# Patient Record
Sex: Female | Born: 1985 | Race: White | Hispanic: No | Marital: Married | Smoking: Never smoker
Health system: Southern US, Community
[De-identification: ages and names within clinical notes are randomized; demographics above are authoritative.]

## PROBLEM LIST (undated history)

## (undated) DIAGNOSIS — R109 Unspecified abdominal pain: Secondary | ICD-10-CM

## (undated) DIAGNOSIS — R079 Chest pain, unspecified: Secondary | ICD-10-CM

## (undated) DIAGNOSIS — F411 Generalized anxiety disorder: Secondary | ICD-10-CM

## (undated) DIAGNOSIS — E876 Hypokalemia: Secondary | ICD-10-CM

## (undated) DIAGNOSIS — J189 Pneumonia, unspecified organism: Secondary | ICD-10-CM

## (undated) DIAGNOSIS — A4902 Methicillin resistant Staphylococcus aureus infection, unspecified site: Secondary | ICD-10-CM

## (undated) DIAGNOSIS — N200 Calculus of kidney: Secondary | ICD-10-CM

## (undated) DIAGNOSIS — D649 Anemia, unspecified: Secondary | ICD-10-CM

## (undated) DIAGNOSIS — R509 Fever, unspecified: Secondary | ICD-10-CM

## (undated) DIAGNOSIS — N10 Acute tubulo-interstitial nephritis: Secondary | ICD-10-CM

## (undated) DIAGNOSIS — E109 Type 1 diabetes mellitus without complications: Secondary | ICD-10-CM

## (undated) DIAGNOSIS — R51 Headache: Secondary | ICD-10-CM

## (undated) DIAGNOSIS — R519 Headache, unspecified: Secondary | ICD-10-CM

## (undated) DIAGNOSIS — N119 Chronic tubulo-interstitial nephritis, unspecified: Secondary | ICD-10-CM

## (undated) DIAGNOSIS — E039 Hypothyroidism, unspecified: Secondary | ICD-10-CM

## (undated) DIAGNOSIS — E7800 Pure hypercholesterolemia, unspecified: Secondary | ICD-10-CM

## (undated) HISTORY — DX: Pure hypercholesterolemia, unspecified: E78.00

## (undated) HISTORY — DX: Hypothyroidism, unspecified: E03.9

## (undated) HISTORY — DX: Acute pyelonephritis: N10

## (undated) HISTORY — DX: Chest pain, unspecified: R07.9

## (undated) HISTORY — DX: Calculus of kidney: N20.0

## (undated) HISTORY — DX: Headache, unspecified: R51.9

## (undated) HISTORY — DX: Headache: R51

## (undated) HISTORY — DX: Chronic tubulo-interstitial nephritis, unspecified: N11.9

## (undated) HISTORY — DX: Type 1 diabetes mellitus without complications: E10.9

## (undated) HISTORY — DX: Hypokalemia: E87.6

## (undated) HISTORY — DX: Fever, unspecified: R50.9

## (undated) HISTORY — DX: Pneumonia, unspecified organism: J18.9

## (undated) HISTORY — DX: Generalized anxiety disorder: F41.1

## (undated) HISTORY — DX: Unspecified abdominal pain: R10.9

## (undated) HISTORY — DX: Methicillin resistant Staphylococcus aureus infection, unspecified site: A49.02

## (undated) HISTORY — DX: Anemia, unspecified: D64.9

---
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
## 2004-10-25 ENCOUNTER — Ambulatory Visit: Payer: Self-pay | Admitting: Endocrinology

## 2005-02-08 ENCOUNTER — Ambulatory Visit: Payer: Self-pay | Admitting: Endocrinology

## 2005-07-21 ENCOUNTER — Ambulatory Visit: Payer: Self-pay | Admitting: Endocrinology

## 2005-07-28 ENCOUNTER — Ambulatory Visit: Admission: RE | Admit: 2005-07-28 | Discharge: 2005-07-28 | Payer: Self-pay | Admitting: Endocrinology

## 2006-03-22 ENCOUNTER — Ambulatory Visit: Payer: Self-pay | Admitting: Endocrinology

## 2006-04-25 ENCOUNTER — Ambulatory Visit: Payer: Self-pay | Admitting: Internal Medicine

## 2006-07-18 ENCOUNTER — Ambulatory Visit: Payer: Self-pay | Admitting: Endocrinology

## 2006-08-15 HISTORY — PX: WISDOM TOOTH EXTRACTION: SHX21

## 2006-11-23 ENCOUNTER — Ambulatory Visit: Payer: Self-pay | Admitting: Internal Medicine

## 2006-11-23 LAB — CONVERTED CEMR LAB
ALT: 24 units/L (ref 0–40)
AST: 24 units/L (ref 0–37)
Albumin: 2.9 g/dL — ABNORMAL LOW (ref 3.5–5.2)
Alkaline Phosphatase: 45 units/L (ref 39–117)
Basophils Relative: 0.1 % (ref 0.0–1.0)
Bilirubin, Direct: 0.1 mg/dL (ref 0.0–0.3)
CO2: 27 meq/L (ref 19–32)
Calcium: 8.8 mg/dL (ref 8.4–10.5)
Creatinine, Ser: 0.6 mg/dL (ref 0.4–1.2)
Direct LDL: 140.4 mg/dL
Eosinophils Absolute: 0.1 10*3/uL (ref 0.0–0.6)
GFR calc Af Amer: 161 mL/min
GFR calc non Af Amer: 133 mL/min
HCT: 33.5 % — ABNORMAL LOW (ref 36.0–46.0)
HDL: 54.6 mg/dL (ref >39.0)
Hemoglobin: 11.6 g/dL — ABNORMAL LOW (ref 12.0–15.0)
Hgb A1c MFr Bld: 12.4 % — ABNORMAL HIGH (ref 4.6–6.0)
Lipase: 11 units/L (ref 11.0–59.0)
Lymphocytes Relative: 16.1 % (ref 12.0–46.0)
Monocytes Absolute: 1 10*3/uL — ABNORMAL HIGH (ref 0.2–0.7)
Monocytes Relative: 10.5 % (ref 3.0–11.0)
Neutrophils Relative %: 72.5 % (ref 43.0–77.0)
Platelets: 240 10*3/uL (ref 150–400)
Potassium: 3.7 meq/L (ref 3.5–5.1)
RDW: 11.8 % (ref 11.5–14.6)

## 2006-11-28 ENCOUNTER — Ambulatory Visit: Payer: Self-pay | Admitting: Vascular Surgery

## 2006-11-28 ENCOUNTER — Ambulatory Visit: Payer: Self-pay | Admitting: Endocrinology

## 2006-11-28 ENCOUNTER — Ambulatory Visit (HOSPITAL_COMMUNITY): Admission: RE | Admit: 2006-11-28 | Discharge: 2006-11-28 | Payer: Self-pay | Admitting: Endocrinology

## 2006-11-28 ENCOUNTER — Encounter: Payer: Self-pay | Admitting: Vascular Surgery

## 2006-12-01 ENCOUNTER — Ambulatory Visit: Payer: Self-pay | Admitting: Endocrinology

## 2006-12-01 LAB — CONVERTED CEMR LAB
Basophils Absolute: 0 10*3/uL (ref 0.0–0.1)
Eosinophils Absolute: 0.1 10*3/uL (ref 0.0–0.6)
Eosinophils Relative: 0.6 % (ref 0.0–5.0)
Lymphocytes Relative: 18.5 % (ref 12.0–46.0)
Neutro Abs: 6.4 10*3/uL (ref 1.4–7.7)
Neutrophils Relative %: 72.1 % (ref 43.0–77.0)
Platelets: 543 10*3/uL — ABNORMAL HIGH (ref 150–400)
RBC: 3.52 M/uL — ABNORMAL LOW (ref 3.87–5.11)
WBC: 9 10*3/uL (ref 4.5–10.5)

## 2006-12-04 ENCOUNTER — Encounter: Admission: RE | Admit: 2006-12-04 | Discharge: 2006-12-04 | Payer: Self-pay | Admitting: Endocrinology

## 2006-12-06 ENCOUNTER — Ambulatory Visit: Payer: Self-pay | Admitting: Internal Medicine

## 2006-12-06 ENCOUNTER — Ambulatory Visit: Payer: Self-pay | Admitting: Endocrinology

## 2006-12-07 ENCOUNTER — Encounter (INDEPENDENT_AMBULATORY_CARE_PROVIDER_SITE_OTHER): Payer: Self-pay | Admitting: Specialist

## 2006-12-07 ENCOUNTER — Ambulatory Visit: Payer: Self-pay | Admitting: Internal Medicine

## 2006-12-07 HISTORY — PX: OTHER SURGICAL HISTORY: SHX169

## 2006-12-12 ENCOUNTER — Ambulatory Visit: Payer: Self-pay | Admitting: Endocrinology

## 2006-12-12 LAB — CONVERTED CEMR LAB
Basophils Absolute: 0 10*3/uL (ref 0.0–0.1)
Basophils Relative: 0.3 % (ref 0.0–1.0)
CO2: 22 meq/L (ref 19–32)
Calcium: 8 mg/dL — ABNORMAL LOW (ref 8.4–10.5)
Eosinophils Absolute: 0.1 10*3/uL (ref 0.0–0.6)
GFR calc Af Amer: 115 mL/min
Glucose, Bld: 147 mg/dL — ABNORMAL HIGH (ref 70–99)
Hemoglobin: 8.5 g/dL — ABNORMAL LOW (ref 12.0–15.0)
Lymphocytes Relative: 6.9 % — ABNORMAL LOW (ref 12.0–46.0)
Monocytes Relative: 7.5 % (ref 3.0–11.0)
Neutro Abs: 12.1 10*3/uL — ABNORMAL HIGH (ref 1.4–7.7)
Neutrophils Relative %: 84.9 % — ABNORMAL HIGH (ref 43.0–77.0)
RBC: 2.72 M/uL — ABNORMAL LOW (ref 3.87–5.11)
RDW: 12 % (ref 11.5–14.6)
Sodium: 136 meq/L (ref 135–145)

## 2006-12-13 ENCOUNTER — Ambulatory Visit: Payer: Self-pay | Admitting: Internal Medicine

## 2006-12-13 ENCOUNTER — Ambulatory Visit: Payer: Self-pay | Admitting: Endocrinology

## 2006-12-15 ENCOUNTER — Ambulatory Visit (HOSPITAL_COMMUNITY): Admission: RE | Admit: 2006-12-15 | Discharge: 2006-12-15 | Payer: Self-pay | Admitting: Endocrinology

## 2006-12-21 ENCOUNTER — Ambulatory Visit: Payer: Self-pay | Admitting: Endocrinology

## 2006-12-25 ENCOUNTER — Ambulatory Visit: Payer: Self-pay | Admitting: Endocrinology

## 2006-12-28 ENCOUNTER — Ambulatory Visit: Payer: Self-pay | Admitting: Endocrinology

## 2006-12-29 ENCOUNTER — Ambulatory Visit: Payer: Self-pay | Admitting: Endocrinology

## 2007-02-26 ENCOUNTER — Encounter: Payer: Self-pay | Admitting: Endocrinology

## 2007-02-26 DIAGNOSIS — E109 Type 1 diabetes mellitus without complications: Secondary | ICD-10-CM | POA: Insufficient documentation

## 2007-09-17 ENCOUNTER — Encounter: Payer: Self-pay | Admitting: Endocrinology

## 2007-09-18 ENCOUNTER — Telehealth: Payer: Self-pay | Admitting: Endocrinology

## 2007-09-20 ENCOUNTER — Encounter: Payer: Self-pay | Admitting: Endocrinology

## 2007-09-25 ENCOUNTER — Encounter: Payer: Self-pay | Admitting: Endocrinology

## 2007-10-05 ENCOUNTER — Ambulatory Visit: Payer: Self-pay | Admitting: Endocrinology

## 2007-10-05 DIAGNOSIS — D649 Anemia, unspecified: Secondary | ICD-10-CM | POA: Insufficient documentation

## 2007-10-05 DIAGNOSIS — J189 Pneumonia, unspecified organism: Secondary | ICD-10-CM | POA: Insufficient documentation

## 2007-10-05 HISTORY — DX: Anemia, unspecified: D64.9

## 2007-10-10 LAB — CONVERTED CEMR LAB
CO2: 29 meq/L (ref 19–32)
Calcium: 9.6 mg/dL (ref 8.4–10.5)
Chloride: 99 meq/L (ref 96–112)
Creatinine, Ser: 0.6 mg/dL (ref 0.4–1.2)
Eosinophils Absolute: 0.1 10*3/uL (ref 0.0–0.6)
GFR calc non Af Amer: 132 mL/min
HCT: 34.6 % — ABNORMAL LOW (ref 36.0–46.0)
Hemoglobin: 11.9 g/dL — ABNORMAL LOW (ref 12.0–15.0)
Lymphocytes Relative: 39.4 % (ref 12.0–46.0)
MCV: 90.9 fL (ref 78.0–100.0)
Monocytes Relative: 8.1 % (ref 3.0–11.0)
Neutrophils Relative %: 50.2 % (ref 43.0–77.0)
Platelets: 582 10*3/uL — ABNORMAL HIGH (ref 150–400)
Potassium: 3 meq/L — ABNORMAL LOW (ref 3.5–5.1)
Sodium: 138 meq/L (ref 135–145)

## 2008-01-23 ENCOUNTER — Telehealth: Payer: Self-pay | Admitting: Endocrinology

## 2008-08-21 ENCOUNTER — Telehealth: Payer: Self-pay | Admitting: Endocrinology

## 2008-08-26 ENCOUNTER — Ambulatory Visit: Payer: Self-pay | Admitting: Endocrinology

## 2008-08-26 DIAGNOSIS — E7800 Pure hypercholesterolemia, unspecified: Secondary | ICD-10-CM

## 2008-08-26 DIAGNOSIS — F411 Generalized anxiety disorder: Secondary | ICD-10-CM | POA: Insufficient documentation

## 2008-08-26 DIAGNOSIS — N119 Chronic tubulo-interstitial nephritis, unspecified: Secondary | ICD-10-CM | POA: Insufficient documentation

## 2008-08-26 DIAGNOSIS — E039 Hypothyroidism, unspecified: Secondary | ICD-10-CM | POA: Insufficient documentation

## 2008-08-26 HISTORY — DX: Hypothyroidism, unspecified: E03.9

## 2008-08-26 HISTORY — DX: Pure hypercholesterolemia, unspecified: E78.00

## 2008-08-26 HISTORY — DX: Generalized anxiety disorder: F41.1

## 2008-08-26 LAB — CONVERTED CEMR LAB
AST: 21 units/L (ref 0–37)
BUN: 16 mg/dL (ref 6–23)
Basophils Relative: 0.9 % (ref 0.0–3.0)
Bilirubin, Direct: 0.1 mg/dL (ref 0.0–0.3)
Calcium: 9.6 mg/dL (ref 8.4–10.5)
Chloride: 102 meq/L (ref 96–112)
Cholesterol: 261 mg/dL (ref 0–200)
Eosinophils Absolute: 0.2 10*3/uL (ref 0.0–0.7)
Eosinophils Relative: 4.7 % (ref 0.0–5.0)
GFR calc Af Amer: 158 mL/min
GFR calc non Af Amer: 131 mL/min
HDL: 36.8 mg/dL — ABNORMAL LOW (ref >39.0)
Hgb A1c MFr Bld: 10.8 % — ABNORMAL HIGH (ref 4.6–6.0)
MCHC: 35.1 g/dL (ref 30.0–36.0)
MCV: 91.8 fL (ref 78.0–100.0)
Microalb, Ur: 25.3 mg/dL — ABNORMAL HIGH (ref 0.0–1.9)
Monocytes Absolute: 0.4 10*3/uL (ref 0.1–1.0)
Neutro Abs: 1.8 10*3/uL (ref 1.4–7.7)
Potassium: 3.5 meq/L (ref 3.5–5.1)
RDW: 11.7 % (ref 11.5–14.6)
Total Bilirubin: 1.2 mg/dL (ref 0.3–1.2)
VLDL: 89 mg/dL — ABNORMAL HIGH (ref 0–40)

## 2008-08-29 ENCOUNTER — Telehealth (INDEPENDENT_AMBULATORY_CARE_PROVIDER_SITE_OTHER): Payer: Self-pay | Admitting: *Deleted

## 2008-09-10 ENCOUNTER — Telehealth (INDEPENDENT_AMBULATORY_CARE_PROVIDER_SITE_OTHER): Payer: Self-pay | Admitting: *Deleted

## 2008-09-11 ENCOUNTER — Telehealth (INDEPENDENT_AMBULATORY_CARE_PROVIDER_SITE_OTHER): Payer: Self-pay | Admitting: *Deleted

## 2008-09-15 ENCOUNTER — Encounter: Payer: Self-pay | Admitting: Endocrinology

## 2008-09-17 ENCOUNTER — Inpatient Hospital Stay (HOSPITAL_COMMUNITY): Admission: AD | Admit: 2008-09-17 | Discharge: 2008-09-18 | Payer: Self-pay | Admitting: Internal Medicine

## 2008-09-17 ENCOUNTER — Ambulatory Visit: Payer: Self-pay | Admitting: Internal Medicine

## 2008-09-25 ENCOUNTER — Telehealth (INDEPENDENT_AMBULATORY_CARE_PROVIDER_SITE_OTHER): Payer: Self-pay | Admitting: *Deleted

## 2008-09-25 ENCOUNTER — Encounter: Payer: Self-pay | Admitting: Endocrinology

## 2008-09-26 ENCOUNTER — Telehealth: Payer: Self-pay | Admitting: Endocrinology

## 2008-09-28 ENCOUNTER — Encounter: Payer: Self-pay | Admitting: Endocrinology

## 2008-10-02 ENCOUNTER — Telehealth (INDEPENDENT_AMBULATORY_CARE_PROVIDER_SITE_OTHER): Payer: Self-pay | Admitting: *Deleted

## 2008-10-06 ENCOUNTER — Encounter: Payer: Self-pay | Admitting: Endocrinology

## 2008-10-07 ENCOUNTER — Telehealth (INDEPENDENT_AMBULATORY_CARE_PROVIDER_SITE_OTHER): Payer: Self-pay | Admitting: *Deleted

## 2008-10-14 ENCOUNTER — Ambulatory Visit: Payer: Self-pay | Admitting: Endocrinology

## 2008-10-22 ENCOUNTER — Encounter: Payer: Self-pay | Admitting: Endocrinology

## 2008-10-22 ENCOUNTER — Telehealth (INDEPENDENT_AMBULATORY_CARE_PROVIDER_SITE_OTHER): Payer: Self-pay | Admitting: *Deleted

## 2008-10-29 ENCOUNTER — Telehealth (INDEPENDENT_AMBULATORY_CARE_PROVIDER_SITE_OTHER): Payer: Self-pay | Admitting: *Deleted

## 2008-11-03 ENCOUNTER — Ambulatory Visit: Payer: Self-pay | Admitting: Endocrinology

## 2008-11-13 ENCOUNTER — Telehealth (INDEPENDENT_AMBULATORY_CARE_PROVIDER_SITE_OTHER): Payer: Self-pay | Admitting: *Deleted

## 2008-11-28 ENCOUNTER — Ambulatory Visit: Payer: Self-pay | Admitting: Endocrinology

## 2008-12-01 ENCOUNTER — Encounter: Payer: Self-pay | Admitting: Endocrinology

## 2008-12-19 ENCOUNTER — Ambulatory Visit: Payer: Self-pay | Admitting: Endocrinology

## 2008-12-31 ENCOUNTER — Telehealth (INDEPENDENT_AMBULATORY_CARE_PROVIDER_SITE_OTHER): Payer: Self-pay | Admitting: *Deleted

## 2009-01-01 ENCOUNTER — Encounter: Payer: Self-pay | Admitting: Endocrinology

## 2009-01-03 ENCOUNTER — Encounter: Payer: Self-pay | Admitting: Endocrinology

## 2009-01-05 ENCOUNTER — Encounter: Payer: Self-pay | Admitting: Endocrinology

## 2009-01-16 ENCOUNTER — Ambulatory Visit: Payer: Self-pay | Admitting: Endocrinology

## 2009-01-19 ENCOUNTER — Telehealth (INDEPENDENT_AMBULATORY_CARE_PROVIDER_SITE_OTHER): Payer: Self-pay | Admitting: *Deleted

## 2009-01-20 ENCOUNTER — Telehealth (INDEPENDENT_AMBULATORY_CARE_PROVIDER_SITE_OTHER): Payer: Self-pay | Admitting: *Deleted

## 2009-02-20 ENCOUNTER — Ambulatory Visit: Payer: Self-pay | Admitting: Endocrinology

## 2009-03-02 ENCOUNTER — Telehealth (INDEPENDENT_AMBULATORY_CARE_PROVIDER_SITE_OTHER): Payer: Self-pay | Admitting: *Deleted

## 2009-03-02 DIAGNOSIS — M79609 Pain in unspecified limb: Secondary | ICD-10-CM | POA: Insufficient documentation

## 2009-04-17 ENCOUNTER — Ambulatory Visit: Payer: Self-pay | Admitting: Endocrinology

## 2009-04-17 LAB — CONVERTED CEMR LAB
AST: 13 units/L (ref 0–37)
Albumin: 4 g/dL (ref 3.5–5.2)
Alkaline Phosphatase: 52 units/L (ref 39–117)
Amylase: 19 units/L — ABNORMAL LOW (ref 27–131)
Bilirubin, Direct: 0.1 mg/dL (ref 0.0–0.3)
Hgb A1c MFr Bld: 6.9 % — ABNORMAL HIGH (ref 4.6–6.5)
Ketones, ur: NEGATIVE mg/dL
Leukocytes, UA: NEGATIVE
TSH: 18.62 microintl units/mL — ABNORMAL HIGH (ref 0.35–5.50)
Total Bilirubin: 0.8 mg/dL (ref 0.3–1.2)
Total Protein, Urine: NEGATIVE mg/dL
Total Protein: 7.7 g/dL (ref 6.0–8.3)
Urine Glucose: NEGATIVE mg/dL
Urobilinogen, UA: 0.2 (ref 0.0–1.0)
pH: 7 (ref 5.0–8.0)

## 2009-04-23 ENCOUNTER — Telehealth: Payer: Self-pay | Admitting: Endocrinology

## 2009-05-07 ENCOUNTER — Telehealth: Payer: Self-pay | Admitting: Endocrinology

## 2009-06-03 ENCOUNTER — Telehealth: Payer: Self-pay | Admitting: Endocrinology

## 2009-06-08 ENCOUNTER — Encounter: Admission: RE | Admit: 2009-06-08 | Discharge: 2009-06-08 | Payer: Self-pay | Admitting: Endocrinology

## 2009-06-12 ENCOUNTER — Telehealth: Payer: Self-pay | Admitting: Endocrinology

## 2009-07-07 ENCOUNTER — Telehealth: Payer: Self-pay | Admitting: Endocrinology

## 2009-07-07 ENCOUNTER — Telehealth: Payer: Self-pay | Admitting: Family Medicine

## 2009-07-07 ENCOUNTER — Ambulatory Visit: Payer: Self-pay | Admitting: Endocrinology

## 2009-07-07 DIAGNOSIS — E876 Hypokalemia: Secondary | ICD-10-CM | POA: Insufficient documentation

## 2009-07-07 HISTORY — DX: Hypokalemia: E87.6

## 2009-07-07 LAB — CONVERTED CEMR LAB
Beta hcg, urine, semiquantitative: POSITIVE
Calcium: 9.1 mg/dL (ref 8.4–10.5)
Chloride: 107 meq/L (ref 96–112)
Creatinine, Ser: 0.6 mg/dL (ref 0.4–1.2)
Eosinophils Relative: 1.5 % (ref 0.0–5.0)
Iron: 53 ug/dL (ref 42–145)
Lymphocytes Relative: 26.6 % (ref 12.0–46.0)
MCHC: 34.9 g/dL (ref 30.0–36.0)
Monocytes Absolute: 0.5 10*3/uL (ref 0.1–1.0)
RDW: 11.9 % (ref 11.5–14.6)
Saturation Ratios: 16.8 % — ABNORMAL LOW (ref 20.0–50.0)
Sodium: 139 meq/L (ref 135–145)
TSH: 4.41 microintl units/mL (ref 0.35–5.50)

## 2009-07-08 ENCOUNTER — Encounter: Payer: Self-pay | Admitting: Endocrinology

## 2009-07-23 ENCOUNTER — Ambulatory Visit (HOSPITAL_COMMUNITY): Admission: RE | Admit: 2009-07-23 | Discharge: 2009-07-23 | Payer: Self-pay | Admitting: Obstetrics and Gynecology

## 2009-07-27 ENCOUNTER — Encounter: Payer: Self-pay | Admitting: Endocrinology

## 2009-08-13 ENCOUNTER — Ambulatory Visit (HOSPITAL_COMMUNITY): Admission: RE | Admit: 2009-08-13 | Discharge: 2009-08-13 | Payer: Self-pay | Admitting: Obstetrics and Gynecology

## 2009-09-07 ENCOUNTER — Ambulatory Visit: Payer: Self-pay | Admitting: Endocrinology

## 2009-10-15 ENCOUNTER — Ambulatory Visit: Payer: Self-pay | Admitting: Endocrinology

## 2009-11-20 ENCOUNTER — Encounter: Payer: Self-pay | Admitting: Endocrinology

## 2009-11-23 ENCOUNTER — Telehealth (INDEPENDENT_AMBULATORY_CARE_PROVIDER_SITE_OTHER): Payer: Self-pay | Admitting: *Deleted

## 2009-12-03 ENCOUNTER — Telehealth: Payer: Self-pay | Admitting: Endocrinology

## 2009-12-03 ENCOUNTER — Ambulatory Visit (HOSPITAL_COMMUNITY): Admission: RE | Admit: 2009-12-03 | Discharge: 2009-12-03 | Payer: Self-pay | Admitting: Obstetrics and Gynecology

## 2009-12-03 ENCOUNTER — Inpatient Hospital Stay (HOSPITAL_COMMUNITY): Admission: AD | Admit: 2009-12-03 | Discharge: 2009-12-09 | Payer: Self-pay | Admitting: Obstetrics and Gynecology

## 2009-12-05 ENCOUNTER — Encounter (INDEPENDENT_AMBULATORY_CARE_PROVIDER_SITE_OTHER): Payer: Self-pay | Admitting: Obstetrics and Gynecology

## 2009-12-09 ENCOUNTER — Encounter
Admission: RE | Admit: 2009-12-09 | Discharge: 2010-01-08 | Payer: Self-pay | Source: Home / Self Care | Admitting: Obstetrics and Gynecology

## 2009-12-30 ENCOUNTER — Ambulatory Visit: Admission: RE | Admit: 2009-12-30 | Discharge: 2009-12-30 | Payer: Self-pay | Admitting: Obstetrics and Gynecology

## 2010-04-22 ENCOUNTER — Telehealth: Payer: Self-pay | Admitting: Endocrinology

## 2010-09-05 ENCOUNTER — Encounter: Payer: Self-pay | Admitting: Endocrinology

## 2010-09-14 NOTE — Progress Notes (Signed)
Summary: OV due  Phone Note Outgoing Call Call back at [facility] Phone [phone]   Call placed by: Brenton Grills MA,  April 22, 2010 8:42 AM Call placed to: Patient Summary of Call: Pt is due for F/U OV, left message for pt to callback Initial call taken by: Brenton Grills MA,  April 22, 2010 8:42 AM  Follow-up for Phone Call        pt will callback to schedule an appointment this afternoon Follow-up by: Brenton Grills MA,  April 26, 2010 11:38 AM

## 2010-09-14 NOTE — Progress Notes (Signed)
Summary: Gluclose from patient/Marie Cook Marie Cook from patient/[name]   Imported By: [address] 11/26/2009 11:03:07  _____________________________________________________________________  External Attachment:    Type:   Image     Comment:   External Document

## 2010-09-14 NOTE — Assessment & Plan Note (Signed)
Summary: 3 mos f/u $50 / cd   Vital Signs:  Patient profile:   25 year old female Height:      67 inches (170.18 cm) Weight:      181.25 pounds (82.39 kg) O2 Sat:      98 % on Room air Temp:     97.4 degrees F (36.33 degrees C) oral Pulse rate:   81 / minute BP sitting:   108 / 72  (left arm) Cuff size:   regular  Vitals Entered By: Josph Macho CMA (September 07, 2009 4:37 PM)  O2 Flow:  Room air CC: 3 month follow up/ CF Is Patient Diabetic? Yes   CC:  3 month follow up/ CF.  History of Present Illness: pt has declined continuous glucose monitor.  pt states she feels well in general.  no cbg record, but states cbg's are well-controlled.  she takes a total of approx 95 units of humalog per day, via the pump.  she has mild hypoglycemia in the middle of the night, approx 2/month.  pt says she will have labs with dr Vincente Poli in a few days.  weight gain is 8 lbs so far.    Current Medications (verified): 1)  Keto-Diastix   Strp (Urine Glucose-Ketones Test) .... As Needed Use  1 Box 2)  Klor-Con 10 10 Meq  Tbcr (Potassium Chloride) .... Qd 3)  Freestyle Test  Strp (Glucose Blood) .Marland Kitchen.. 12/day  250.03 Variable Glucoses 4)  Humalog 100 Unit/ml Soln (Insulin Lispro (Human)) .... For Use in Pump, Total of 120  Units/day 5)  Glucagon Emergency 1 Mg Kit (Glucagon (Rdna)) .... As Dir 6)  Onetouch Ultrasoft Lancets  Misc (Lancets) .Marland Kitchen.. 12/day.  Variable Glucoses.  250.01 7)  Levothyroxine Sodium 150 Mcg Tabs (Levothyroxine Sodium) .Marland Kitchen.. 1 Qd  Allergies (verified): 1)  ! 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    Physical Exam  General:  gravid. no distress. Skin:  insulin infusion site at right triceps area is normal    Impression & Recommendations:  Problem # 1:  DIABETES MELLITUS, TYPE I (ICD-250.01)  Other Orders: Est. Patient Level  III (16109)  Patient Instructions: 1)  reduce basal rate to 1.6 units/hr, 24 hrs per day.   2)  continue mealtime bolus to 1 unit/ 8 grams carbohydrate 3)  continue correction bolus (which some people call "sensitivity," or "insulin sensitivity ratio," or just "isr") of 1 unit for each 30 by which your glucose exceeds 100. 4)  if you are active, carefully watch your glucose to observe any lows. 5)  Please schedule a follow-up appointment in 4 weeks. 6)  tests are being ordered for you today.  a few days after the test(s), please call [phone] to hear your test results. 7)  i'll await results of labs with dr Vincente Poli, which pt is due to have done within the next few days.

## 2010-09-14 NOTE — Assessment & Plan Note (Signed)
Summary: FU Marie Cook  #   Vital Signs:  Patient profile:   25 year old female Height:      67 inches (170.18 cm) Weight:      190 pounds (86.36 kg) O2 Sat:      98 % on Room air Temp:     97.0 degrees F (36.11 degrees C) oral Pulse rate:   87 / minute BP sitting:   112 / 82  (left arm) Cuff size:   regular  Vitals Entered By: Josph Macho RMA (October 15, 2009 8:17 AM)  O2 Flow:  Room air CC: Follow-up visit/ CF Is Patient Diabetic? Yes   CC:  Follow-up visit/ CF.  History of Present Illness: pt states she feels well in general.  she is now at [redacted] weeks gestation.  no cbg record, but states cbg's are "200's all the time."  she takes approx 90 units of humalog, via her pump, per day.   pt says her tsh was also recently normal with dr Vincente Poli.  Current Medications (verified): 1)  Keto-Diastix   Strp (Urine Glucose-Ketones Test) .... As Needed Use  1 Box 2)  Klor-Con 10 10 Meq  Tbcr (Potassium Chloride) .... Qd 3)  Freestyle Test  Strp (Glucose Blood) .Marland Kitchen.. 12/day  250.03 Variable Glucoses 4)  Humalog 100 Unit/ml Soln (Insulin Lispro (Human)) .... For Use in Pump, Total of 120  Units/day 5)  Glucagon Emergency 1 Mg Kit (Glucagon (Rdna)) .... As Dir 6)  Onetouch Ultrasoft Lancets  Misc (Lancets) .Marland Kitchen.. 12/day.  Variable Glucoses.  250.01 7)  Levothyroxine Sodium 150 Mcg Tabs (Levothyroxine Sodium) .Marland Kitchen.. 1 Qd  Allergies (verified): 1)  ! * Tamiflu  Past History:  Past Medical History: Reviewed history from 10/05/2007 and no changes required. 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    Physical Exam  General:  gravid.  no distress.  Neck:  thyroid is slightly enlarged, but no nodule. Additional Exam:  pt says her a1c was 5.6 last week   Impression & Recommendations:  Problem # 1:  DIABETES MELLITUS, TYPE I (ICD-250.01) needs increased rx  Problem # 2:  HYPOTHYROIDISM (ICD-244.9) apparently  well-replaced  Other Orders: Est. Patient Level III (04540)  Patient Instructions: 1)  increase basal rate to 2 units/hr, 24 hrs per day.   2)  increase mealtime bolus to 1 unit/ 6 grams carbohydrate 3)  continue correction bolus (which some people call "sensitivity," or "insulin sensitivity ratio," or just "isr") of 1 unit for each 30 by which your glucose exceeds 100. 4)  if you are active, carefully watch your glucose to observe any lows. 5)  Please schedule a follow-up appointment in 1 week. 6)  check your blood sugar 4 times a day--before the 3 meals, and at bedtime.  also check if you have symptoms of your blood sugar being too high or too low.  please keep a record of the readings and bring it to your next appointment here.  please call us sooner if you are having low blood sugar episodes. Prescriptions: HUMALOG 100 UNIT/ML SOLN (INSULIN LISPRO (HUMAN)) for use in pump, total of 120  units/day  #12 vials x 3   Entered and Authorized by:   Minus Breeding MD   Signed by:   Minus Breeding MD on 10/15/2009   Method used:   Electronically to        [facility]* (mail-order)             ,  Ph: [phone]       Fax: [phone]   RxID:   [id number]

## 2010-09-14 NOTE — Progress Notes (Signed)
Summary: FYI/SAE pt  Phone Note Call from Patient   Caller: Patient [phone] Summary of Call: pt called to advise MD that she is on her way to [facility] to be admitted. Pt is 35w pregnant. Initial call taken by: Margaret Pyle, CMA,  December 03, 2009 1:10 PM  Follow-up for Phone Call        noted;  will also pass on to Dr Everardo All to review when he returns Follow-up by: Corwin Levins MD,  December 03, 2009 1:37 PM  Additional Follow-up for Phone Call Additional follow up Details #1::        noted, thank you Additional Follow-up by: Minus Breeding MD,  December 04, 2009 1:28 PM

## 2010-09-14 NOTE — Progress Notes (Signed)
  Phone Note Outgoing Call   Summary of Call: Informed pt that MD states to use same pump settings except add 2 units to calculated supper. OV next week. pt states she will call back later to schedule appt. Initial call taken by: Josph Macho RMA,  November 23, 2009 9:40 AM

## 2010-10-07 ENCOUNTER — Ambulatory Visit (INDEPENDENT_AMBULATORY_CARE_PROVIDER_SITE_OTHER): Payer: 59 | Admitting: Endocrinology

## 2010-10-07 ENCOUNTER — Encounter: Payer: Self-pay | Admitting: Endocrinology

## 2010-10-07 DIAGNOSIS — E109 Type 1 diabetes mellitus without complications: Secondary | ICD-10-CM

## 2010-10-12 NOTE — Assessment & Plan Note (Signed)
Summary: PER PT MD REQUEST HER TO SCHED FU  STC   Vital Signs:  Patient profile:   25 year old female Height:      67 inches (170.18 cm) Weight:      164.38 pounds (74.72 kg) BMI:     25.84 O2 Sat:      99 % on Room air Temp:     98.5 degrees F (36.94 degrees C) oral Pulse rate:   97 / minute Pulse rhythm:   regular BP sitting:   104 / 72  (left arm) Cuff size:   regular  Vitals Entered By: Brenton Grills CMA (AAMA) (October 07, 2010 3:30 PM)  O2 Flow:  Room air  History of Present Illness: pt's c-section last year was complicated my mrsa folliculitis.  she is 10 mos postpartum.  she gained 50 lbs with the pregnancy, and has since re-lost 30 of that.   she takes a total of approx 100 units/day.  she says cbg's are rarely low, and this is in the early hrs of the am.  Current Medications (verified): 1)  Keto-Diastix   Strp (Urine Glucose-Ketones Test) .... As Needed Use  1 Box 2)  Freestyle Test  Strp (Glucose Blood) .Marland Kitchen.. 12/day  250.03 Variable Glucoses 3)  Humalog 100 Unit/ml Soln (Insulin Lispro (Human)) .... For Use in Pump, Total of 120  Units/day 4)  Glucagon Emergency 1 Mg Kit (Glucagon (Rdna)) .... As Dir 5)  Onetouch Ultrasoft Lancets  Misc (Lancets) .Marland Kitchen.. 12/day.  Variable Glucoses.  250.01 6)  Synthroid 125 Mcg Tabs (Levothyroxine Sodium) .Marland Kitchen.. 1 Tablet By Mouth Once Daily 7)  Loestrin 24 Fe 1-20 Mg-Mcg Tabs (Norethin Ace-Eth Estrad-Fe) .... Once Daily 8)  Rifampin 300 Mg Caps (Rifampin) .Marland Kitchen.. 1 Capsule By Mouth Two Times A Day 9)  Bactrim Ds 800-160 Mg Tabs (Sulfamethoxazole-Trimethoprim) .Marland Kitchen.. 1 Tablet By Mouth Once Daily 10)  Tumeric .Marland Kitchen.. 1 By Mouth Once Daily 11)  Probiotic  Caps (Probiotic Product) .Marland Kitchen.. 1 By Mouth Two Times A Day  Allergies (verified): 1)  ! 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    Physical  Exam  General:  Well developed, well nourished, in no acute distress.  Pulses:  dorsalis pedis intact bilat.   Extremities:  no deformity.  no ulcer on the feet.  feet are of normal color and temp.  no edema  Neurologic:  sensation is intact to touch on the feet  Additional Exam:  (pt says a1c was 8.0, 3 weeks ago).    Impression & Recommendations:  Problem # 1:  DIABETES MELLITUS, TYPE I (ICD-250.01) needs increased rx  Medications Added to Medication List This Visit: 1)  Synthroid 125 Mcg Tabs (Levothyroxine sodium) .Marland Kitchen.. 1 tablet by mouth once daily 2)  Loestrin 24 Fe 1-20 Mg-mcg Tabs (Norethin ace-eth estrad-fe) .... Once daily 3)  Rifampin 300 Mg Caps (Rifampin) .Marland Kitchen.. 1 capsule by mouth two times a day 4)  Bactrim Ds 800-160 Mg Tabs (Sulfamethoxazole-trimethoprim) .Marland Kitchen.. 1 tablet by mouth once daily 5)  Tumeric  .Marland Kitchen.. 1 by mouth once daily 6)  Probiotic Caps (Probiotic product) .Marland Kitchen.. 1 by mouth two times a day  Other Orders: Est. Patient Level III (16109)  Patient Instructions: 1)  continue basal rate of 1.3 units/hr, except for 1.5 units/hr, midnight to 8 am 2)  increase mealtime bolus to 1 unit/ 5 grams  carbohydrate 3)  continue correction bolus (which some people call "sensitivity," or "insulin sensitivity ratio," or just "isr") of 1 unit for each 30 by which your glucose exceeds 120.  4)  Please schedule a follow-up appointment in 3 months. Prescriptions: HUMALOG 100 UNIT/ML SOLN (INSULIN LISPRO (HUMAN)) for use in pump, total of 120  units/day  #12 vials x 3   Entered and Authorized by:   Minus Breeding MD   Signed by:   Minus Breeding MD on 10/07/2010   Method used:   Electronically to        [facility]* (retail)             ,          Ph: [phone]       Fax: [phone]   RxID:   [id number]    Orders Added: 1)  Est. Patient Level III [84132]

## 2010-11-02 LAB — COMPREHENSIVE METABOLIC PANEL
ALT: 24 U/L (ref 0–35)
AST: 18 U/L (ref 0–37)
AST: 24 U/L (ref 0–37)
Albumin: 2.1 g/dL — ABNORMAL LOW (ref 3.5–5.2)
Albumin: 2.2 g/dL — ABNORMAL LOW (ref 3.5–5.2)
Albumin: 2.7 g/dL — ABNORMAL LOW (ref 3.5–5.2)
Alkaline Phosphatase: 106 U/L (ref 39–117)
Alkaline Phosphatase: 108 U/L (ref 39–117)
BUN: 15 mg/dL (ref 6–23)
BUN: 17 mg/dL (ref 6–23)
BUN: 18 mg/dL (ref 6–23)
CO2: 22 mEq/L (ref 19–32)
CO2: 22 mEq/L (ref 19–32)
CO2: 22 mEq/L (ref 19–32)
Calcium: 7.9 mg/dL — ABNORMAL LOW (ref 8.4–10.5)
Calcium: 7.9 mg/dL — ABNORMAL LOW (ref 8.4–10.5)
Chloride: 103 mEq/L (ref 96–112)
Chloride: 107 mEq/L (ref 96–112)
Creatinine, Ser: 0.66 mg/dL (ref 0.4–1.2)
Creatinine, Ser: 0.74 mg/dL (ref 0.4–1.2)
GFR calc Af Amer: >60 mL/min (ref >60)
GFR calc Af Amer: >60 mL/min (ref >60)
GFR calc Af Amer: >60 mL/min (ref >60)
GFR calc non Af Amer: >60 mL/min (ref >60)
GFR calc non Af Amer: >60 mL/min (ref >60)
GFR calc non Af Amer: >60 mL/min (ref >60)
Potassium: 3.5 mEq/L (ref 3.5–5.1)
Potassium: 4.4 mEq/L (ref 3.5–5.1)
Sodium: 137 mEq/L (ref 135–145)
Total Bilirubin: 0.2 mg/dL — ABNORMAL LOW (ref 0.3–1.2)
Total Bilirubin: 0.4 mg/dL (ref 0.3–1.2)
Total Protein: 4.8 g/dL — ABNORMAL LOW (ref 6.0–8.3)
Total Protein: 5.6 g/dL — ABNORMAL LOW (ref 6.0–8.3)

## 2010-11-02 LAB — GLUCOSE, CAPILLARY
Glucose-Capillary: 109 mg/dL — ABNORMAL HIGH (ref 70–99)
Glucose-Capillary: 148 mg/dL — ABNORMAL HIGH (ref 70–99)
Glucose-Capillary: 161 mg/dL — ABNORMAL HIGH (ref 70–99)
Glucose-Capillary: 164 mg/dL — ABNORMAL HIGH (ref 70–99)
Glucose-Capillary: 214 mg/dL — ABNORMAL HIGH (ref 70–99)
Glucose-Capillary: 22 mg/dL — CL (ref 70–99)
Glucose-Capillary: 253 mg/dL — ABNORMAL HIGH (ref 70–99)
Glucose-Capillary: 50 mg/dL — ABNORMAL LOW (ref 70–99)
Glucose-Capillary: 65 mg/dL — ABNORMAL LOW (ref 70–99)
Glucose-Capillary: 84 mg/dL (ref 70–99)
Glucose-Capillary: 97 mg/dL (ref 70–99)

## 2010-11-02 LAB — CBC
HCT: 28.3 % — ABNORMAL LOW (ref 36.0–46.0)
HCT: 29.3 % — ABNORMAL LOW (ref 36.0–46.0)
HCT: 32.3 % — ABNORMAL LOW (ref 36.0–46.0)
HCT: 32.8 % — ABNORMAL LOW (ref 36.0–46.0)
HCT: 36.6 % (ref 36.0–46.0)
Hemoglobin: 10.1 g/dL — ABNORMAL LOW (ref 12.0–15.0)
Hemoglobin: 12.6 g/dL (ref 12.0–15.0)
Hemoglobin: 9.8 g/dL — ABNORMAL LOW (ref 12.0–15.0)
MCHC: 34.6 g/dL (ref 30.0–36.0)
MCHC: 34.8 g/dL (ref 30.0–36.0)
MCV: 91.8 fL (ref 78.0–100.0)
MCV: 91.9 fL (ref 78.0–100.0)
MCV: 92.1 fL (ref 78.0–100.0)
MCV: 92.4 fL (ref 78.0–100.0)
MCV: 93.3 fL (ref 78.0–100.0)
Platelets: 174 10*3/uL (ref 150–400)
Platelets: 176 10*3/uL (ref 150–400)
RBC: 3.14 MIL/uL — ABNORMAL LOW (ref 3.87–5.11)
RBC: 3.49 MIL/uL — ABNORMAL LOW (ref 3.87–5.11)
RBC: 3.57 MIL/uL — ABNORMAL LOW (ref 3.87–5.11)
RBC: 3.77 MIL/uL — ABNORMAL LOW (ref 3.87–5.11)
RDW: 13.2 % (ref 11.5–15.5)
RDW: 13.4 % (ref 11.5–15.5)
WBC: 10.2 10*3/uL (ref 4.0–10.5)
WBC: 10.4 10*3/uL (ref 4.0–10.5)
WBC: 10.8 10*3/uL — ABNORMAL HIGH (ref 4.0–10.5)
WBC: 11.2 10*3/uL — ABNORMAL HIGH (ref 4.0–10.5)
WBC: 9.9 10*3/uL (ref 4.0–10.5)

## 2010-11-02 LAB — RPR: RPR Ser Ql: NONREACTIVE

## 2010-11-02 LAB — CREATININE CLEARANCE, URINE, 24 HOUR
Creatinine Clearance: 155 mL/min — ABNORMAL HIGH (ref 75–115)
Creatinine, Urine: 125.06 mg/dL

## 2010-11-02 LAB — PROTEIN, URINE, 24 HOUR
Collection Interval-UPROT: 24 hours
Protein, 24H Urine: 4225 mg/d — ABNORMAL HIGH (ref 50–100)
Protein, Urine: 338 mg/dL
Urine Total Volume-UPROT: 1250 mL

## 2010-11-02 LAB — LACTATE DEHYDROGENASE
LDH: 143 U/L (ref 94–250)
LDH: 263 U/L — ABNORMAL HIGH (ref 94–250)

## 2010-11-02 LAB — URIC ACID
Uric Acid, Serum: 5 mg/dL (ref 2.4–7.0)
Uric Acid, Serum: 5.2 mg/dL (ref 2.4–7.0)
Uric Acid, Serum: 5.2 mg/dL (ref 2.4–7.0)

## 2010-11-30 LAB — BASIC METABOLIC PANEL
BUN: 11 mg/dL (ref 6–23)
BUN: 6 mg/dL (ref 6–23)
CO2: 15 mEq/L — ABNORMAL LOW (ref 19–32)
CO2: 18 mEq/L — ABNORMAL LOW (ref 19–32)
Calcium: 8.5 mg/dL (ref 8.4–10.5)
Calcium: 9.1 mg/dL (ref 8.4–10.5)
Chloride: 109 mEq/L (ref 96–112)
Chloride: 110 mEq/L (ref 96–112)
Creatinine, Ser: 0.82 mg/dL (ref 0.4–1.2)
Creatinine, Ser: 0.86 mg/dL (ref 0.4–1.2)
GFR calc Af Amer: >60 mL/min (ref >60)
GFR calc Af Amer: >60 mL/min (ref >60)
GFR calc Af Amer: >60 mL/min (ref >60)
GFR calc non Af Amer: >60 mL/min (ref >60)
GFR calc non Af Amer: >60 mL/min (ref >60)
Glucose, Bld: 83 mg/dL (ref 70–99)
Potassium: 3.2 mEq/L — ABNORMAL LOW (ref 3.5–5.1)
Potassium: 3.6 mEq/L (ref 3.5–5.1)
Sodium: 133 mEq/L — ABNORMAL LOW (ref 135–145)
Sodium: 136 mEq/L (ref 135–145)

## 2010-11-30 LAB — GLUCOSE, CAPILLARY
Glucose-Capillary: 124 mg/dL — ABNORMAL HIGH (ref 70–99)
Glucose-Capillary: 156 mg/dL — ABNORMAL HIGH (ref 70–99)
Glucose-Capillary: 183 mg/dL — ABNORMAL HIGH (ref 70–99)
Glucose-Capillary: 199 mg/dL — ABNORMAL HIGH (ref 70–99)
Glucose-Capillary: 200 mg/dL — ABNORMAL HIGH (ref 70–99)
Glucose-Capillary: 207 mg/dL — ABNORMAL HIGH (ref 70–99)
Glucose-Capillary: 218 mg/dL — ABNORMAL HIGH (ref 70–99)
Glucose-Capillary: 234 mg/dL — ABNORMAL HIGH (ref 70–99)
Glucose-Capillary: 243 mg/dL — ABNORMAL HIGH (ref 70–99)
Glucose-Capillary: 83 mg/dL (ref 70–99)

## 2010-11-30 LAB — HEMOGLOBIN A1C
Hgb A1c MFr Bld: 11.8 % — ABNORMAL HIGH (ref 4.6–6.1)
Mean Plasma Glucose: 292 mg/dL

## 2010-11-30 LAB — URINE CULTURE

## 2010-11-30 LAB — PHOSPHORUS: Phosphorus: 2.2 mg/dL — ABNORMAL LOW (ref 2.3–4.6)

## 2010-11-30 LAB — MAGNESIUM: Magnesium: 1.9 mg/dL (ref 1.5–2.5)

## 2010-12-28 NOTE — Assessment & Plan Note (Signed)
[facility]                         GASTROENTEROLOGY OFFICE NOTE   NAME:BAYNES, Charlye                        MRN:          [id number]  DATE:12/11/2006                            DOB:          March 23, 1986    CHIEF COMPLAINT:  Persistent pain, now with fever again.   Ms. Valla Leaver called the office.  She still feels bloated and has a very  sore area in the epigastrium.  Her biopsies from her upper GI endoscopy  showed no evidence of sprue and she had benign gastric mucosa were  gastritis was suspected.  Now she is having recurrent fever, which she  had previously in her illness over the past several weeks.  She said  that she has had a T-max of 103, and she has been taking Tylenol all  day.  She is having fairly severe left flank pain, but no urinary  symptoms.  She was previously treated for urinary tract infection by  primary care.  She says that she has had cystitis and urinary tract  infections before and this does not feel like that. She goes to church  with Dr. Braulio Conte in [facility], who suggested that she might  have typhoid fever.  She has had a negative ultrasound.  She is not  having right upper quadrant pain, or obvious gallbladder symptoms.   ASSESSMENT:  Fever, abdominal pain, and bloating in a type 1 diabetic.  She has left flank and back pain as well.  I think that she is going to  need an evaluation and probably some other studies; will discuss with  Dr. Everardo All, her primary care physician, in the morning.  Repeat  physical exam would be appropriate, I am a little confused as to where  her problems are coming from, she has some GI symptoms, but not all.  So,  possibilities include a HIDA scan, CT imaging, and I think that she  needs a CBC, CMET, and a urinalysis again.  She is not toxic or anything  like that, she has had the fever that she reported, and I told her to go  to the emergency room overnight, if things became severe;  but otherwise,  we would contact her again in the morning.     Iva Boop, MD,FACG  Electronically Signed    CEG/MedQ  DD: 12/11/2006  DT: 12/12/2006  Job #: [id number]   cc:   Gregary Signs A. Everardo All, MD

## 2010-12-31 NOTE — H&P (Signed)
[place]  Patient:    Marie Cook, Marie Cook Visit Number: [id number] MRN: [id number]          Service Type: MED Location: [phone] Attending Physician:  Beatris Ship Dictated by:   Pearletha Furl Jacky Kindle, M.D. Admit Date:  10/06/2001                           History and Physical  CHIEF COMPLAINT:  DKA.  HISTORY OF PRESENT ILLNESS:  The patient is a 25 year old white female who presents at this time upon transfer from [facility] with diabetic ketoacidosis.  She has had diabetes mellitus, type 1, since she was approximately 25 years of age and hospitalized initially upon presentation. Shortly thereafter, she evidently had a one-day stay for DKA but subsequent to which has had no further episodes.  Her control has been excellent on multiple daily injections and she has been exceptionally athletic, playing both basketball and ______ .  She is currently a Holiday representative in high school and was up in [place] for a pre-college trip, having been dropped off there by her mother Thursday evening.  Friday, she had a relatively normal day and partook in all of the pre-college activities. Friday evening, she ate at the [employer], which consisted primarily of pizza and salad from a salad bar.  Her blood sugars have been traditionally excellent but it was a bit elevated last evening but she relates she felt quite well.  She awakened this morning feeling nauseated and subsequently developed the onset of nausea and vomiting.  She contacted her mother to pick her up early from the college weekend but due to progressive nausea and vomiting and onset of shortness of breath and chest pain en route back to [facility], presented to the [facility] Emergency Room.  At [facility], her blood sugar was noted to be in excess of 400, pH 7.22 and serum positive for acetone.  Urine pregnancy test was negative  and chest x-ray reportedly negative.  She was treated with injections of insulin and IV fluids and after my acceptance, she transferred down to [facility] for further management.  Her last menstrual period was one week ago.  She denies any abdominal pain and no further nausea and vomiting since transfer.  She has had no cough or congestion but does relate a minor sore throat following throwing up.  Preceding throwing up, she relates no symptoms.  ALLERGIES:  Patient relates no known drug allergies.  MEDICATIONS:  Humulin N insulin and Humalog insulin -- multiple daily injections consisting of Humalog before meals and b.i.d. NPH.  PAST MEDICAL HISTORY:  Medical illnesses include diabetes mellitus, type 1, for approximately nine years.  SURGICAL ILLNESSES:  None.  SOCIAL HISTORY:  Patient lives with her mother, is a Holiday representative in high school. They reside in [place].  She is extremely active in ______  and basketball. She does not smoke or drink and does not utilize drugs.  PHYSICAL EXAMINATION:  VITAL SIGNS:  Temperature 98, blood pressure 110/70, pulse is 105 and regular, respiratory rate is 18 and unlabored at this time.  GENERAL:  Patient is a tired-appearing female, no distress, lying supine.  SKIN:  Warm, non-diaphoretic, good turgor.  HEENT:  Anicteric.  Pupils round and reactive.  Oropharynx benign.  No yeast.  NECK:  No JVD or bruits.  LUNGS:  Clear.  CARDIOVASCULAR:  Regular rate and rhythm, tachycardia.  No  murmur, gallop, rub or heave.  ABDOMEN:  Soft, nontender.  No hepatosplenomegaly.  Bowel sounds are normal.  EXTREMITIES:  Without cyanosis, clubbing or edema.  Pulses intact.  Joints normal.  NEUROLOGIC:  She is nonfocal.  ASSESSMENT: 1. Diabetic ketoacidosis, precipitating factor most likely viral versus food    poisoning, but clearly no nidus of infection identified at this time and    she is subsequently stabilizing since transfer. 2. Diabetes mellitus,  type 1, excellent control as baseline.  PLAN:  Patient is to be admitted for IV fluids, IV insulin, close monitoring of glucose and eventual transition back to her baseline regimen. Dictated by:   Pearletha Furl Jacky Kindle, M.D. Attending Physician:  Beatris Ship DD:  10/06/01 TD:  10/07/01 Job: [id number]

## 2010-12-31 NOTE — Letter (Signed)
August 13, 2006    [facility]  [address]  [place], [place] [address]   RE:  HETAL, PROANO  MRN:  [id number]  /  DOB:  1985/10/30   Dear Ms. Baynes:   This is to state, for the record, that I agree with your plan to  continue taking the drug Exubera despite the fact that the product is  no longer being marketed.  Please let me know if there is anything else  I can do to help you obtain this product.    Sincerely,      Sean A. Everardo All, MD  Electronically Signed    SAE/MedQ  DD: 08/13/2006  DT: 08/13/2006  Job #: [id number]

## 2010-12-31 NOTE — Assessment & Plan Note (Signed)
[facility]                         GASTROENTEROLOGY OFFICE NOTE   NAME:BAYNESDeniah, Saia                      MRN:          [id number]  DATE:12/06/2006                            DOB:          Jun 10, 1986    CHIEF COMPLAINT:  Bloating, swelling of the abdomen that started 3 weeks  ago.   HISTORY:  This is a 25 year old woman with type 1 diabetes who started  developing some lower extremity swelling and abdominal swelling, a  little bit of facial swelling a few weeks ago.  Sometime thereafter, she  had a fever.  She has been seeing the primary care physicians, Dr.  Everardo All and Dr. Jonny Ruiz multiple times, but now describes persistent  bloating and epigastric pain and soreness.  The fever eventually  resolved.  She had CBC, CMET, amylase, lipase, all unrevealing, except  for a mild anemia with a hemoglobin of 11.  She had a urinary tract  infection diagnosed in November 23, 2006, and this was treated.  She is  still quite concerned about the bloating and distention.  There are no  bowel habit changes.  There is no heartburn.  She has been on multiple  proton pump inhibitors without relief including Prilosec OTC.  Her  weight has gone from 136 pounds in December to 150 pounds now.  She is  quite concerned about her upcoming wedding in 6 weeks.  She plans a  foreign vacation for the honeymoon and is very concerned about having  these symptoms at this time.   CURRENT MEDICATIONS:  1. Lantus sliding scale at bedtime.  2. Exubera sliding scale with meals.   ALLERGIES:  There are no known drug allergies.   PAST MEDICAL HISTORY:  Diabetes mellitus and dyslipidemia.  She is a  type 1 diabetic.   SOCIAL HISTORY:  She does not smoke.  She is here with her mother.  Due  to get married.   REVIEW OF SYSTEMS:  As above.  The remainder of the review of systems is  documented in the chart.   PHYSICAL EXAMINATION:  GENERAL:  A pleasant young white woman in no  acute  distress.  HEENT:  Weight 150 pounds, blood pressure 100/62, pulse 96, height 5  feet, 7 inches.  Eyes - anicteric.  NECK:  Supple.  CHEST:  Clear without rales or wheezes.  HEART:  S1 and S2.  No rubs, murmurs or gallops.  EXTREMITIES:  The lower extremities show trace peripheral edema right  about the ankle.  She is quite concerned and says I can't see my  ankle.  ABDOMEN:  The abdomen shows an area of tenderness below the xyphoid, not  on the xyphoid, that is relatively focal, about the size of a half  dollar or so.  Tender to pressure.  There is no other organomegaly or  mass.  The pain is no really changed with abdominal distention.  There  is no hernia.  SKIN:  Warm and dry without any acute rash that I could see in the areas  inspected.  PSYCHIATRIC:  She appears appropriate and alert and oriented and has  a  relatively normal affect, I think.   LABORATORY DATA:  Lab results were reviewed and in the chart.   Of note, abdominal ultrasound performed on December 04, 2006 revealed  borderline to mild hepatomegaly, but otherwise normal study.   ASSESSMENT:  Epigastric pain and bloating sensation.  I should note, her  albumin is low.  Her hemoglobin A1c is 12%, and the albumin is about  2.9.  She says her blood sugars are controlled, but her blood sugar was  200 when she was tested recently.  Obviously, she does not have adequate  control of her blood sugar with a hemoglobin A1c of 12%.  She is quite  concerned about this bloating and the pain and the ability to fit into  her wedding dress, though she has not tried that.  At this point, I  think an ulcer is possible, though she seems to have abdominal wall  tenderness.  There was no history of antecedent vomiting or coughing or  anything that would produce this, but that is what the clinical exam  looks most like.  As far as the low albumin, maybe that is related to  diabetes.  Maybe she had some sort of viral infection or syndrome  and is  slowly recovering from that.  Cipro did not seem to help her bloating,  though bacterial overgrowth is a consideration, and certainly she is a  diabetic, so motility issues with bloating are quite possible.   PLAN:  1. Upper GI endoscopy tomorrow.  Risks, benefits and indications      explained.  She understands and agrees to proceed.  2. She has a phobia of needles.  Lorazepam 1 mg one or two prior to      her IV will be provided to the patient.  3. Further plans pending this.  I do not think a CT would reveal much      more in this situation.  I think there are some situational      stressors having some effect on this illness and this pain.  Though      it may not be the root cause, there is certainly some overlay in my      opinion.  4. Small bowel biopsies for celiac sprue, she is at increased risk      given type I DM.   Further plans pending the above.     Iva Boop, MD,FACG  Electronically Signed    CEG/MedQ  DD: 12/06/2006  DT: 12/06/2006  Job #: [id number]   cc:   Gregary Signs A. Everardo All, MD

## 2010-12-31 NOTE — Letter (Signed)
August 13, 2006     RE:  Marie Cook, Marie Cook  MRN:  [id number]  /  DOB:  [birth year]   No dictation    Sincerely,      Gregary Signs A. Everardo All, MD  Electronically Signed    SAE/MedQ  DD: 08/13/2006  DT: 08/13/2006  Job #: [id number]

## 2011-01-06 ENCOUNTER — Ambulatory Visit: Payer: 59 | Admitting: Endocrinology

## 2011-01-06 DIAGNOSIS — Z0289 Encounter for other administrative examinations: Secondary | ICD-10-CM

## 2011-04-29 ENCOUNTER — Ambulatory Visit (INDEPENDENT_AMBULATORY_CARE_PROVIDER_SITE_OTHER): Payer: PRIVATE HEALTH INSURANCE | Admitting: Endocrinology

## 2011-04-29 ENCOUNTER — Encounter: Payer: Self-pay | Admitting: Endocrinology

## 2011-04-29 DIAGNOSIS — E109 Type 1 diabetes mellitus without complications: Secondary | ICD-10-CM

## 2011-04-29 MED ORDER — GLUCOSE BLOOD VI STRP: ORAL_STRIP | Status: DC

## 2011-04-29 MED ORDER — INSULIN LISPRO 100 UNIT/ML ~~LOC~~ SOLN: SUBCUTANEOUS | Status: DC

## 2011-04-29 NOTE — Patient Instructions (Addendum)
reduce basal rate to 1.2 units/hr, 24 hrs.  increase mealtime bolus to 1 unit/ 4 grams carbohydrate. continue correction bolus (which some people call "sensitivity," or "insulin sensitivity ratio," or just "isr") of 1 unit for each 30 by which your glucose exceeds 120.  Please schedule a follow-up appointment in 3 months.   Please consider the "dexcom seven" continuous glucose monitor.  Let me know if you want an appointment to learn more.

## 2011-04-29 NOTE — Progress Notes (Signed)
  Subjective:    Patient ID: Marie Cook, female    DOB: 04/26/1986, 25 y.o.   MRN: [id number]  HPI Pt is now 16 mos postpartum.  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  no cbg record, but states cbg's are sometimes low in the early hrs of the am.  She takes a total of approx 90 units humalog per day via the "omnipod" insulin infusion pump.   Past Medical History  Diagnosis Date  . HYPOTHYROIDISM 08/26/2008  . DIABETES MELLITUS, TYPE I 02/26/2007  . HYPERCHOLESTEROLEMIA 08/26/2008  . UNSPECIFIED ANEMIA 10/05/2007  . Anxiety state, unspecified 08/26/2008  . HYPOKALEMIA 07/07/2009    Past Surgical History  Procedure Date  . Edg 12/07/2006    History   Social History  . Marital Status: Married    Spouse Name: N/A    Number of Children: N/A  . Years of Education: N/A   Occupational History  . Not on file.   Social History Main Topics  . Smoking status: Never Smoker   . Smokeless tobacco: Not on file  . Alcohol Use: Not on file  . Drug Use: Not on file  . Sexually Active: Not on file   Other Topics Concern  . Not on file   Social History Narrative  . No narrative on file    Current Outpatient Prescriptions on File Prior to Visit  Medication Sig Dispense Refill  . glucagon (GLUCAGON EMERGENCY) 1 MG injection As directed       . Lancets (ONETOUCH ULTRASOFT) lancets 12/day. Variable glucoses. Dx 250.01       . levothyroxine (SYNTHROID, LEVOTHROID) 125 MCG tablet Take 125 mcg by mouth daily.        Marland Kitchen PROBIOTIC CAPS 3 capsules by mouth in morning  3 capsules by mouth with dinner      . rifampin (RIFADIN) 300 MG capsule Take 300 mg by mouth 2 (two) times daily.       . Urine Glucose-Ketones Test (KETO-DIASTIX) STRP As needed use-1 box         Allergies  Allergen Reactions  . Latex   . Oseltamivir Phosphate     No family history on file.  BP 104/72  Pulse 80  Temp(Src) 98.2 F (36.8 C) (Oral)  Ht 5\' 8"  (1.727 m)  Wt 165 lb 12.8 oz  (75.206 kg)  BMI 25.21 kg/m2  SpO2 98%  LMP 04/02/2011  Review of Systems Denies loc.    Objective:   Physical Exam SKIN: Insulin infusion site (left lat thigh) is normal Pulses: dorsalis pedis intact bilat.   Feet: no deformity.  no ulcer on the feet.  feet are of normal color and temp.  no edema Neuro: sensation is intact to touch on the feet   outside test results are reviewed: A1c=8.0    Assessment & Plan:  Dm, needs increased rx

## 2011-07-29 ENCOUNTER — Ambulatory Visit: Payer: PRIVATE HEALTH INSURANCE | Admitting: Endocrinology

## 2011-07-29 DIAGNOSIS — Z0289 Encounter for other administrative examinations: Secondary | ICD-10-CM

## 2011-09-29 ENCOUNTER — Other Ambulatory Visit (INDEPENDENT_AMBULATORY_CARE_PROVIDER_SITE_OTHER): Payer: PRIVATE HEALTH INSURANCE

## 2011-09-29 ENCOUNTER — Encounter: Payer: Self-pay | Admitting: Endocrinology

## 2011-09-29 ENCOUNTER — Ambulatory Visit (INDEPENDENT_AMBULATORY_CARE_PROVIDER_SITE_OTHER): Payer: PRIVATE HEALTH INSURANCE | Admitting: Endocrinology

## 2011-09-29 DIAGNOSIS — E7800 Pure hypercholesterolemia, unspecified: Secondary | ICD-10-CM

## 2011-09-29 DIAGNOSIS — E109 Type 1 diabetes mellitus without complications: Secondary | ICD-10-CM

## 2011-09-29 DIAGNOSIS — D649 Anemia, unspecified: Secondary | ICD-10-CM

## 2011-09-29 DIAGNOSIS — E876 Hypokalemia: Secondary | ICD-10-CM

## 2011-09-29 DIAGNOSIS — M791 Myalgia, unspecified site: Secondary | ICD-10-CM

## 2011-09-29 DIAGNOSIS — IMO0001 Reserved for inherently not codable concepts without codable children: Secondary | ICD-10-CM

## 2011-09-29 DIAGNOSIS — E039 Hypothyroidism, unspecified: Secondary | ICD-10-CM

## 2011-09-29 LAB — CBC WITH DIFFERENTIAL/PLATELET
Basophils Relative: 0.7 % (ref 0.0–3.0)
Eosinophils Relative: 4.3 % (ref 0.0–5.0)
Lymphocytes Relative: 37.7 % (ref 12.0–46.0)
MCV: 90.2 fl (ref 78.0–100.0)
Monocytes Absolute: 0.4 10*3/uL (ref 0.1–1.0)
Neutrophils Relative %: 51 % (ref 43.0–77.0)
RBC: 4.66 Mil/uL (ref 3.87–5.11)
WBC: 6.4 10*3/uL (ref 4.5–10.5)

## 2011-09-29 LAB — VITAMIN B12: Vitamin B-12: 192 pg/mL — ABNORMAL LOW (ref 211–911)

## 2011-09-29 LAB — BASIC METABOLIC PANEL
BUN: 15 mg/dL (ref 6–23)
CO2: 25 mEq/L (ref 19–32)
Calcium: 9.6 mg/dL (ref 8.4–10.5)
Creatinine, Ser: 0.8 mg/dL (ref 0.4–1.2)
Glucose, Bld: 87 mg/dL (ref 70–99)

## 2011-09-29 LAB — URINALYSIS, ROUTINE W REFLEX MICROSCOPIC
Hgb urine dipstick: NEGATIVE
Total Protein, Urine: NEGATIVE
Urine Glucose: NEGATIVE

## 2011-09-29 LAB — LIPID PANEL
Cholesterol: 150 mg/dL (ref 0–200)
HDL: 50 mg/dL (ref >39.00)
Triglycerides: 130 mg/dL (ref 0.0–149.0)
VLDL: 26 mg/dL (ref 0.0–40.0)

## 2011-09-29 LAB — MICROALBUMIN / CREATININE URINE RATIO: Microalb Creat Ratio: 1.3 mg/g (ref 0.0–30.0)

## 2011-09-29 LAB — HEPATIC FUNCTION PANEL
Alkaline Phosphatase: 39 U/L (ref 39–117)
Bilirubin, Direct: 0.1 mg/dL (ref 0.0–0.3)
Total Bilirubin: 0.9 mg/dL (ref 0.3–1.2)

## 2011-09-29 LAB — IBC PANEL
Saturation Ratios: 39.6 % (ref 20.0–50.0)
Transferrin: 295.6 mg/dL (ref 212.0–360.0)

## 2011-09-29 NOTE — Patient Instructions (Addendum)
blood tests are being requested for you today.  please call [phone] to hear your test results.  You will be prompted to enter the 9-digit "MRN" number that appears at the top left of this page, followed by #.  Then you will hear the message. continue basal rate to 1.2 units/hr, 24 hrs.  increase mealtime bolus to 1 unit/ 4 grams carbohydrate.  (if your a1c is high, we'll increase this) continue correction bolus (which some people call "sensitivity," or "insulin sensitivity ratio," or just "isr") of 1 unit for each 30 by which your glucose exceeds 120. Please come back for a follow-up appointment in 3 months.   (update: i left message on phone-tree:  Increase mealtime bolus to 1 unit/3.5 grams cho.  Start b-12

## 2011-09-29 NOTE — Progress Notes (Signed)
Subjective:    Patient ID: Marie Cook, female    DOB: 20-Feb-1986, 26 y.o.   MRN: [id number]  HPI Pt states 1 week of moderate arthralgias and myalgias throughout the body, but no assoc fever.  She also has headache, muscle cramps, and nausea.  She does not feel as though she has a flu-like illness.  She says tsh was recently normal at her gyn dr.   no cbg record, but states cbg's are well-controlled.  She averages approx 80-100 units of humalog per day, via her pump.  She has hypoglycemia only approx 1/month.  Past Medical History  Diagnosis Date  . HYPOTHYROIDISM 08/26/2008  . DIABETES MELLITUS, TYPE I 02/26/2007  . HYPERCHOLESTEROLEMIA 08/26/2008  . UNSPECIFIED ANEMIA 10/05/2007  . Anxiety state, unspecified 08/26/2008  . HYPOKALEMIA 07/07/2009    Past Surgical History  Procedure Date  . Edg 12/07/2006    History   Social History  . Marital Status: Married    Spouse Name: N/A    Number of Children: N/A  . Years of Education: N/A   Occupational History  . Not on file.   Social History Main Topics  . Smoking status: Never Smoker   . Smokeless tobacco: Not on file  . Alcohol Use: Not on file  . Drug Use: Not on file  . Sexually Active: Not on file   Other Topics Concern  . Not on file   Social History Narrative  . No narrative on file    Current Outpatient Prescriptions on File Prior to Visit  Medication Sig Dispense Refill  . AZURETTE 0.15-0.02/0.01 MG (21/5) tablet Take 1 tablet by mouth daily.       Marland Kitchen glucagon (GLUCAGON EMERGENCY) 1 MG injection As directed       . glucose blood (FREESTYLE LITE) test strip 12/day dx 250.03, variable glucoses  1080 each  3  . insulin lispro (HUMALOG) 100 UNIT/ML injection For use in pump, total of 120 units/day  120 mL  3  . Lancets (ONETOUCH ULTRASOFT) lancets 12/day. Variable glucoses. Dx 250.01       . Urine Glucose-Ketones Test (KETO-DIASTIX) STRP As needed use-1 box         Allergies  Allergen Reactions  . Latex   .  Oseltamivir Phosphate     No family history on file.  BP 118/80  Pulse 81  Temp(Src) 97.7 F (36.5 C) (Oral)  Ht 5\' 7"  (1.702 m)  Wt 166 lb (75.297 kg)  BMI 26.00 kg/m2  SpO2 99%  LMP 09/27/2011    Review of Systems Denies numbness and rash.      Objective:   Physical Exam VITAL SIGNS:  See vs page GENERAL: no distress Pulses: dorsalis pedis intact bilat.   Feet: no deformity.  no ulcer on the feet.  feet are of normal color and temp.  no edema Neuro: sensation is intact to touch on the feet MSK:  No muscle or joint tenderness    Lab Results  Component Value Date   WBC 6.4 09/29/2011   HGB 14.4 09/29/2011   HCT 42.0 09/29/2011   PLT 419.0* 09/29/2011   GLUCOSE 87 09/29/2011   CHOL 150 09/29/2011   TRIG 130.0 09/29/2011   HDL 50.00 09/29/2011   LDLDIRECT 135.2 08/26/2008   LDLCALC 74 09/29/2011   ALT 11 09/29/2011   AST 13 09/29/2011   NA 138 09/29/2011   K 3.7 09/29/2011   CL 103 09/29/2011   CREATININE 0.8 09/29/2011   BUN 15 09/29/2011  CO2 25 09/29/2011   TSH 0.46 09/29/2011   HGBA1C 8.3* 09/29/2011   MICROALBUR 1.3 09/29/2011  vit b-12 is low    Assessment & Plan:  Arthralgias, uncertain etiology, new b-12 deficiency, new Anemia, better Hypothyroidism, well-replaced Type 1 DM, needs increased rx

## 2011-11-01 ENCOUNTER — Other Ambulatory Visit: Payer: Self-pay | Admitting: Obstetrics and Gynecology

## 2011-11-21 ENCOUNTER — Other Ambulatory Visit (INDEPENDENT_AMBULATORY_CARE_PROVIDER_SITE_OTHER): Payer: PRIVATE HEALTH INSURANCE

## 2011-11-21 ENCOUNTER — Ambulatory Visit (INDEPENDENT_AMBULATORY_CARE_PROVIDER_SITE_OTHER): Payer: PRIVATE HEALTH INSURANCE | Admitting: Endocrinology

## 2011-11-21 ENCOUNTER — Encounter: Payer: Self-pay | Admitting: Endocrinology

## 2011-11-21 VITALS — BP 102/70 | HR 95 | Temp 98.2°F | Ht 67.0 in | Wt 167.1 lb

## 2011-11-21 DIAGNOSIS — R509 Fever, unspecified: Secondary | ICD-10-CM

## 2011-11-21 DIAGNOSIS — D649 Anemia, unspecified: Secondary | ICD-10-CM

## 2011-11-21 DIAGNOSIS — R519 Headache, unspecified: Secondary | ICD-10-CM | POA: Insufficient documentation

## 2011-11-21 DIAGNOSIS — R42 Dizziness and giddiness: Secondary | ICD-10-CM

## 2011-11-21 DIAGNOSIS — R51 Headache: Secondary | ICD-10-CM

## 2011-11-21 DIAGNOSIS — N119 Chronic tubulo-interstitial nephritis, unspecified: Secondary | ICD-10-CM

## 2011-11-21 LAB — CBC WITH DIFFERENTIAL/PLATELET
Basophils Absolute: 0 10*3/uL (ref 0.0–0.1)
Basophils Relative: 0.7 % (ref 0.0–3.0)
Eosinophils Absolute: 0.2 10*3/uL (ref 0.0–0.7)
HCT: 40.4 % (ref 36.0–46.0)
Hemoglobin: 13.6 g/dL (ref 12.0–15.0)
Lymphs Abs: 2.6 10*3/uL (ref 0.7–4.0)
MCHC: 33.6 g/dL (ref 30.0–36.0)
Neutro Abs: 3.3 10*3/uL (ref 1.4–7.7)
RDW: 12.2 % (ref 11.5–14.6)

## 2011-11-21 LAB — URINALYSIS, ROUTINE W REFLEX MICROSCOPIC
Bilirubin Urine: NEGATIVE
Ketones, ur: NEGATIVE
Nitrite: NEGATIVE
Total Protein, Urine: NEGATIVE
Urine Glucose: 250
pH: 6 (ref 5.0–8.0)

## 2011-11-21 NOTE — Patient Instructions (Addendum)
blood tests are being requested for you today.  We'll call you with results.   I hope you feel better soon.  If you don't feel better by next week, please call back.

## 2011-11-21 NOTE — Progress Notes (Signed)
Subjective:    Patient ID: Marie Cook, female    DOB: June 14, 1986, 26 y.o.   MRN: [id number]  HPI Pt states 2 weeks of slight pain at the urethra, in the context of urination.  She was found to have uti, and was rx'ed cipro, tamiflu, and macrobid.  She is leaving for a cruise in 2 days.   LMP was few days ago. no cbg record, but states cbg's are well-controlled. Past Medical History  Diagnosis Date  . HYPOTHYROIDISM 08/26/2008  . DIABETES MELLITUS, TYPE I 02/26/2007  . HYPERCHOLESTEROLEMIA 08/26/2008  . UNSPECIFIED ANEMIA 10/05/2007  . Anxiety state, unspecified 08/26/2008  . HYPOKALEMIA 07/07/2009    Past Surgical History  Procedure Date  . Edg 12/07/2006    History   Social History  . Marital Status: Married    Spouse Name: N/A    Number of Children: N/A  . Years of Education: N/A   Occupational History  . Not on file.   Social History Main Topics  . Smoking status: Never Smoker   . Smokeless tobacco: Not on file  . Alcohol Use: Not on file  . Drug Use: Not on file  . Sexually Active: Not on file   Other Topics Concern  . Not on file   Social History Narrative  . No narrative on file    Current Outpatient Prescriptions on File Prior to Visit  Medication Sig Dispense Refill  . AZURETTE 0.15-0.02/0.01 MG (21/5) tablet Take 1 tablet by mouth daily.       . cyanocobalamin (,VITAMIN B-12,) 1000 MCG/ML injection Inject 1,000 mcg into the muscle every 30 (thirty) days.      Marland Kitchen glucagon (GLUCAGON EMERGENCY) 1 MG injection As directed       . glucose blood (FREESTYLE LITE) test strip 12/day dx 250.03, variable glucoses  1080 each  3  . insulin lispro (HUMALOG) 100 UNIT/ML injection For use in pump, total of 120 units/day  120 mL  3  . Lancets (ONETOUCH ULTRASOFT) lancets 12/day. Variable glucoses. Dx 250.01       . levothyroxine (SYNTHROID, LEVOTHROID) 137 MCG tablet Take 137 mcg by mouth daily.      . Urine Glucose-Ketones Test (KETO-DIASTIX) STRP As needed use-1 box          Allergies  Allergen Reactions  . Latex   . Oseltamivir Phosphate     No family history on file.  BP 102/70  Pulse 95  Temp(Src) 98.2 F (36.8 C) (Oral)  Ht 5\' 7"  (1.702 m)  Wt 167 lb 1.9 oz (75.805 kg)  BMI 26.17 kg/m2  SpO2 97%    Review of Systems She has myalgias and lightheadedness.  Denies diarrhea.  She has diaphoresis.  She feels as though she has fever.  She has slight abd cramps, but no diarrhea.  Denies n/v/loc/cough.    Objective:   Physical Exam VITAL SIGNS:  See vs page GENERAL: no distress head: no deformity eyes: no periorbital swelling, no proptosis external nose and ears are normal mouth: no lesion seen LUNGS:  Clear to auscultation ABDOMEN: abdomen is soft, nontender.  no hepatosplenomegaly.  not distended.  no hernia     (i reviewed ua results) Lab Results  Component Value Date   WBC 6.6 11/21/2011   HGB 13.6 11/21/2011   HCT 40.4 11/21/2011   PLT 423.0* 11/21/2011   GLUCOSE 112* 11/21/2011   CHOL 150 09/29/2011   TRIG 130.0 09/29/2011   HDL 50.00 09/29/2011   LDLDIRECT 135.2 08/26/2008  LDLCALC 74 09/29/2011   ALT 11 09/29/2011   AST 13 09/29/2011   NA 140 11/21/2011   K 3.8 11/21/2011   CL 103 11/21/2011   CREATININE 0.9 11/21/2011   BUN 15 11/21/2011   CO2 28 11/21/2011   TSH 0.46 09/29/2011   HGBA1C 8.3* 09/29/2011   MICROALBUR 1.3 09/29/2011      Assessment & Plan:  ? Uti, recurrent DM, apparently well-controlled GI sxs, new, uncertain etiology

## 2011-11-22 ENCOUNTER — Telehealth: Payer: Self-pay

## 2011-11-22 LAB — BASIC METABOLIC PANEL
CO2: 28 mEq/L (ref 19–32)
Chloride: 103 mEq/L (ref 96–112)
Glucose, Bld: 112 mg/dL — ABNORMAL HIGH (ref 70–99)
Potassium: 3.8 mEq/L (ref 3.5–5.1)
Sodium: 140 mEq/L (ref 135–145)

## 2011-11-22 MED ORDER — CEFUROXIME AXETIL 500 MG PO TABS: 500.0000 mg | ORAL_TABLET | Freq: Two times a day (BID) | ORAL | Status: AC

## 2011-11-22 MED ORDER — ACYCLOVIR 800 MG PO TABS: ORAL_TABLET | ORAL | Status: DC

## 2011-11-22 NOTE — Telephone Encounter (Signed)
i sent rx 

## 2011-11-22 NOTE — Telephone Encounter (Signed)
Pt informed rx sent.

## 2011-11-22 NOTE — Telephone Encounter (Signed)
[facility] Triage Call Report Triage Record Num: [id number] Operator: Lodema Pilot Patient Name: Marie Cook Call Date & Time: 11/21/2011 6:19:20PM Patient Phone: [phone] PCP: Romero Belling Patient Gender: Female PCP Fax : [phone] Patient DOB: 05/09/1986 [facility] Name[facility] Reason for Call: Caller: [name]/Patient; PCP: Romero Belling; CB#: [phone]; Call regarding Cold Sore; Pt was in office on 11/21/11. (?) Kidney infection. Pt had lab work completed. Pt also requested RX for cold sore to upper lip. Onset: 2 days. Pt is leaving for [place] [employer] on 11/23/11. Pt has tried Abbreva, but no results. Per Cold Sores Protocol, advised to f/u with office in AM. Care advice given. Protocol(s) Used: Cold Sores Recommended Outcome per Protocol: See Provider within 24 hours Reason for Outcome: Severe pain AND unresponsive to 24 hours of home care Care Advice: ~ Use straw to avoid lip, tongue and gum lesions. Avoid contact with infants or anyone who has dermatitis or is immunosuppressed (such as diabetes, HIV/AIDS, renal disease, chemotherapy, organ transplant, or chronic steroid use). ~ ~ SYMPTOM / CONDITION MANAGEMENT ~ Apply ice chip to ulceration for 10 to 15 minutes three or four times a day or suck on ice chip to reduce pain. Consider application of local drying or soothing agent (e.g., Blistex, Debrox, Abreva) to the area every two hours while awake or as directed on the package or by pharmacist. ~ 11/21/2011 6:33:08PM Page 1 of 1 [id number]

## 2011-12-26 ENCOUNTER — Other Ambulatory Visit: Payer: Self-pay | Admitting: Endocrinology

## 2011-12-26 ENCOUNTER — Telehealth: Payer: Self-pay

## 2011-12-26 MED ORDER — OMNIPOD MISC: Status: AC

## 2011-12-26 NOTE — Telephone Encounter (Signed)
i called pt 12/26/11.  i printed rx Please send to pt

## 2011-12-26 NOTE — Telephone Encounter (Signed)
Pt called requesting a return call from MD regarding questions she has about insulin pump.

## 2011-12-27 NOTE — Telephone Encounter (Signed)
Rx mailed to pt, left message on pt's VM informing pt rx mailed to pt.

## 2012-06-01 ENCOUNTER — Ambulatory Visit (INDEPENDENT_AMBULATORY_CARE_PROVIDER_SITE_OTHER): Payer: PRIVATE HEALTH INSURANCE | Admitting: Endocrinology

## 2012-06-01 ENCOUNTER — Ambulatory Visit
Admission: RE | Admit: 2012-06-01 | Discharge: 2012-06-01 | Disposition: A | Discharge status: Home / Self Care | Payer: PRIVATE HEALTH INSURANCE | Source: Ambulatory Visit | Attending: Endocrinology | Admitting: Endocrinology

## 2012-06-01 ENCOUNTER — Telehealth: Payer: Self-pay | Admitting: Endocrinology

## 2012-06-01 ENCOUNTER — Encounter: Payer: Self-pay | Admitting: Endocrinology

## 2012-06-01 VITALS — BP 112/64 | HR 78 | Temp 98.1°F | Wt 172.0 lb

## 2012-06-01 DIAGNOSIS — E7800 Pure hypercholesterolemia, unspecified: Secondary | ICD-10-CM

## 2012-06-01 DIAGNOSIS — E109 Type 1 diabetes mellitus without complications: Secondary | ICD-10-CM

## 2012-06-01 DIAGNOSIS — N209 Urinary calculus, unspecified: Secondary | ICD-10-CM

## 2012-06-01 DIAGNOSIS — R109 Unspecified abdominal pain: Secondary | ICD-10-CM

## 2012-06-01 DIAGNOSIS — E039 Hypothyroidism, unspecified: Secondary | ICD-10-CM

## 2012-06-01 DIAGNOSIS — E876 Hypokalemia: Secondary | ICD-10-CM

## 2012-06-01 DIAGNOSIS — D649 Anemia, unspecified: Secondary | ICD-10-CM

## 2012-06-01 NOTE — Telephone Encounter (Signed)
Caller: Marie Cook/Patient; Phone: [phone]; Reason for Call: Pt has recurrent gall bladder pain and would like to have ultrasound done today; can Dr Everardo All order ultrasound for her?  Insurance will only pay if MD orders.  Please call as soon as possible to advise.

## 2012-06-01 NOTE — Progress Notes (Signed)
  Subjective:    Patient ID: Marie Cook, female    DOB: December 10, 1985, 26 y.o.   MRN: [id number]  HPI Pt states few years of intermittent moderate pain at the right flank, and assoc pain at the right posterior chest.  sxs happen usually after eating, but not necessarily with any certain type of food.   no cbg record, but states cbg's are well-controlled. Past Medical History  Diagnosis Date  . HYPOTHYROIDISM 08/26/2008  . DIABETES MELLITUS, TYPE I 02/26/2007  . HYPERCHOLESTEROLEMIA 08/26/2008  . UNSPECIFIED ANEMIA 10/05/2007  . Anxiety state, unspecified 08/26/2008  . HYPOKALEMIA 07/07/2009    Past Surgical History  Procedure Date  . Edg 12/07/2006    History   Social History  . Marital Status: Married    Spouse Name: N/A    Number of Children: N/A  . Years of Education: N/A   Occupational History  . Not on file.   Social History Main Topics  . Smoking status: Never Smoker   . Smokeless tobacco: Not on file  . Alcohol Use: Not on file  . Drug Use: Not on file  . Sexually Active: Not on file   Other Topics Concern  . Not on file   Social History Narrative  . No narrative on file    Current Outpatient Prescriptions on File Prior to Visit  Medication Sig Dispense Refill  . AZURETTE 0.15-0.02/0.01 MG (21/5) tablet Take 1 tablet by mouth daily.       . cyanocobalamin (,VITAMIN B-12,) 1000 MCG/ML injection Inject 1,000 mcg into the muscle every 30 (thirty) days.      Marland Kitchen glucagon (GLUCAGON EMERGENCY) 1 MG injection As directed       . glucose blood (FREESTYLE LITE) test strip 12/day dx 250.03, variable glucoses  1080 each  3  . insulin lispro (HUMALOG) 100 UNIT/ML injection For use in pump, total of 120 units/day  120 mL  3  . Insulin Pump Disposable (OMNIPOD) MISC 1 pod every day  90 each  3  . Lancets (ONETOUCH ULTRASOFT) lancets 12/day. Variable glucoses. Dx 250.01       . levothyroxine (SYNTHROID, LEVOTHROID) 137 MCG tablet Take 137 mcg by mouth daily.      .  nitrofurantoin, macrocrystal-monohydrate, (MACROBID) 100 MG capsule Take 100 mg by mouth 2 (two) times daily.      . Urine Glucose-Ketones Test (KETO-DIASTIX) STRP As needed use-1 box       . acyclovir (ZOVIRAX) 800 MG tablet 1 pill, 5x a day  35 tablet  0    Allergies  Allergen Reactions  . Latex   . Oseltamivir Phosphate     No family history on file.  BP 112/64  Pulse 78  Temp 98.1 F (36.7 C) (Oral)  Wt 172 lb (78.019 kg)  Review of Systems Denies fever and sob.      Objective:   Physical Exam VITAL SIGNS:  See vs page.   GENERAL: no distress.   LUNGS:  Clear to auscultation. HEART:  Regular rate and rhythm without murmurs noted. Normal S1,S2.   ABDOMEN: abdomen is soft, nontender.  no hepatosplenomegaly.  not distended.  no hernia.     (i reviewed [place] result)    Assessment & Plan:  Urolithiasis, new Flank pain, uncertain if related to urolithiasis DM, uncertain control

## 2012-06-01 NOTE — Patient Instructions (Addendum)
blood tests are being requested for you today.  You will be contacted with results. Let's also check the ultrasound.

## 2012-06-01 NOTE — Telephone Encounter (Signed)
Pt coming in for appt today at 3pm

## 2012-06-01 NOTE — Telephone Encounter (Signed)
i would need to see pt in order to do this

## 2012-06-03 DIAGNOSIS — N209 Urinary calculus, unspecified: Secondary | ICD-10-CM | POA: Insufficient documentation

## 2012-06-25 ENCOUNTER — Ambulatory Visit: Payer: PRIVATE HEALTH INSURANCE | Admitting: Endocrinology

## 2012-06-25 ENCOUNTER — Other Ambulatory Visit: Payer: Self-pay | Admitting: Endocrinology

## 2012-06-25 ENCOUNTER — Telehealth: Payer: Self-pay | Admitting: *Deleted

## 2012-06-25 DIAGNOSIS — D649 Anemia, unspecified: Secondary | ICD-10-CM

## 2012-06-25 DIAGNOSIS — E876 Hypokalemia: Secondary | ICD-10-CM

## 2012-06-25 DIAGNOSIS — E039 Hypothyroidism, unspecified: Secondary | ICD-10-CM

## 2012-06-25 DIAGNOSIS — Z79899 Other long term (current) drug therapy: Secondary | ICD-10-CM | POA: Insufficient documentation

## 2012-06-25 DIAGNOSIS — F411 Generalized anxiety disorder: Secondary | ICD-10-CM

## 2012-06-25 DIAGNOSIS — E109 Type 1 diabetes mellitus without complications: Secondary | ICD-10-CM

## 2012-06-25 LAB — CBC WITH DIFFERENTIAL/PLATELET
Basophils Relative: 0 % (ref 0–1)
Hemoglobin: 12.8 g/dL (ref 12.0–15.0)
Lymphs Abs: 2 10*3/uL (ref 0.7–4.0)
Monocytes Relative: 5 % (ref 3–12)
Neutro Abs: 18.3 10*3/uL — ABNORMAL HIGH (ref 1.7–7.7)
Neutrophils Relative %: 86 % — ABNORMAL HIGH (ref 43–77)
RBC: 4.43 MIL/uL (ref 3.87–5.11)

## 2012-06-25 NOTE — Telephone Encounter (Signed)
i ordered labs 

## 2012-06-25 NOTE — Telephone Encounter (Signed)
What labs do you want pt to have?

## 2012-06-26 ENCOUNTER — Telehealth: Payer: Self-pay

## 2012-06-26 LAB — HEPATIC FUNCTION PANEL
Albumin: 4.2 g/dL (ref 3.5–5.2)
Total Protein: 7.4 g/dL (ref 6.0–8.3)

## 2012-06-26 LAB — URINALYSIS, ROUTINE W REFLEX MICROSCOPIC
Bilirubin Urine: NEGATIVE
Hgb urine dipstick: NEGATIVE
Protein, ur: NEGATIVE mg/dL
Urobilinogen, UA: 0.2 mg/dL (ref 0.0–1.0)

## 2012-06-26 LAB — HEMOGLOBIN A1C
Hgb A1c MFr Bld: 8.3 % — ABNORMAL HIGH (ref <5.7)
Mean Plasma Glucose: 192 mg/dL — ABNORMAL HIGH (ref <117)

## 2012-06-26 LAB — IBC PANEL
TIBC: 339 ug/dL (ref 250–470)
UIBC: 315 ug/dL (ref 125–400)

## 2012-06-26 LAB — MICROALBUMIN / CREATININE URINE RATIO
Creatinine, Urine: 34.9 mg/dL
Microalb Creat Ratio: 22.3 mg/g (ref 0.0–30.0)
Microalb, Ur: 0.78 mg/dL (ref 0.00–1.89)

## 2012-06-26 LAB — LIPID PANEL
Cholesterol: 148 mg/dL (ref 0–200)
LDL Cholesterol: 86 mg/dL (ref 0–99)
VLDL: 15 mg/dL (ref 0–40)

## 2012-06-26 LAB — URINALYSIS, MICROSCOPIC ONLY
Bacteria, UA: NONE SEEN
Casts: NONE SEEN
Crystals: NONE SEEN

## 2012-06-26 LAB — BASIC METABOLIC PANEL
Calcium: 9.4 mg/dL (ref 8.4–10.5)
Chloride: 102 mEq/L (ref 96–112)
Creat: 0.79 mg/dL (ref 0.50–1.10)

## 2012-06-26 NOTE — Telephone Encounter (Signed)
Pt would like a copy of her labs mailed to her

## 2012-06-26 NOTE — Telephone Encounter (Signed)
Mailed copy of labs to pt

## 2012-07-02 ENCOUNTER — Telehealth: Payer: Self-pay | Admitting: Endocrinology

## 2012-07-02 DIAGNOSIS — R109 Unspecified abdominal pain: Secondary | ICD-10-CM

## 2012-07-02 NOTE — Telephone Encounter (Signed)
Caller: Marie Cook/Patient; Phone: [phone]; Reason for Call: To minimize an hour travel time one way; baby sitter arrangments and co-pays patient is asking if she can avoid an appt with Dr.  Everardo All to tell her what she has already been told by her MD's need to be done which is a CT scan of body and HIDA scan, she is wondering if Dr.  Everardo All could just order these tests and then she follow up with him afterwards.  .  Had a CT done 5 yrs ago at a [place] [facility] near [place].  Please call patient w/ answer.

## 2012-07-03 DIAGNOSIS — R109 Unspecified abdominal pain: Secondary | ICD-10-CM | POA: Insufficient documentation

## 2012-07-03 NOTE — Telephone Encounter (Signed)
Ok, i made referral

## 2012-07-03 NOTE — Telephone Encounter (Signed)
Pt advised request for referral made

## 2012-07-03 NOTE — Telephone Encounter (Signed)
Please advise on this patient request. sue

## 2012-07-03 NOTE — Telephone Encounter (Signed)
Patient states she would prefer to use a gastroenterologist through the [facility] in [place] if you could referr her there. Patient does not want to use a doctor in [place].

## 2012-07-03 NOTE — Telephone Encounter (Signed)
If you are still having symptoms, you should see a specialist.  i would be happy to ref you to dr in [place]

## 2012-07-05 ENCOUNTER — Telehealth: Payer: Self-pay | Admitting: Internal Medicine

## 2012-07-05 NOTE — Telephone Encounter (Signed)
I approve of switch.  

## 2012-07-05 NOTE — Telephone Encounter (Signed)
Patient has history with Dr. Leone Payor in 2008.  She would like to change providers.  She felt Dr. Leone Payor didn't help her and "told me he didn't know what was wrong".  She is being referred for abdominal pain.  Dr. Leone Payor do you approve of switch.

## 2012-07-05 NOTE — Telephone Encounter (Signed)
I spoke with the patient about switch.  She was asked who in the practice she would like to switch to.  She states that they are going to refer her to another GI group and asked that I cancel any future appts.

## 2012-07-09 ENCOUNTER — Other Ambulatory Visit: Payer: Self-pay | Admitting: Gastroenterology

## 2012-07-09 ENCOUNTER — Encounter: Payer: Self-pay | Admitting: Internal Medicine

## 2012-07-09 ENCOUNTER — Telehealth: Payer: Self-pay | Admitting: Endocrinology

## 2012-07-09 DIAGNOSIS — R1011 Right upper quadrant pain: Secondary | ICD-10-CM

## 2012-07-09 NOTE — Telephone Encounter (Signed)
Forward 3 pages from [facility] to Dr. Romero Belling for review on 07-09-12 ym

## 2012-07-24 ENCOUNTER — Encounter (HOSPITAL_COMMUNITY): Payer: PRIVATE HEALTH INSURANCE

## 2012-07-26 ENCOUNTER — Encounter (HOSPITAL_COMMUNITY)
Admission: RE | Admit: 2012-07-26 | Discharge: 2012-07-26 | Disposition: A | Discharge status: Home / Self Care | Payer: PRIVATE HEALTH INSURANCE | Source: Ambulatory Visit | Attending: Gastroenterology | Admitting: Gastroenterology

## 2012-07-26 DIAGNOSIS — R1011 Right upper quadrant pain: Secondary | ICD-10-CM | POA: Insufficient documentation

## 2012-07-26 MED ORDER — SINCALIDE 5 MCG IJ SOLR
INTRAMUSCULAR | Status: AC
  Administered 2012-07-26: 1.62 ug via INTRAVENOUS
  Filled 2012-07-26: qty 5

## 2012-07-26 MED ORDER — TECHNETIUM TC 99M MEBROFENIN IV KIT
5.0000 | PACK | Freq: Once | INTRAVENOUS | Status: AC | PRN
  Administered 2012-07-26: 5 via INTRAVENOUS

## 2012-07-26 MED ORDER — SINCALIDE 5 MCG IJ SOLR
0.0200 ug/kg | Freq: Once | INTRAMUSCULAR | Status: AC
  Administered 2012-07-26: 1.62 ug via INTRAVENOUS

## 2012-08-13 ENCOUNTER — Ambulatory Visit: Payer: PRIVATE HEALTH INSURANCE | Admitting: Internal Medicine

## 2012-08-13 ENCOUNTER — Telehealth: Payer: Self-pay | Admitting: Endocrinology

## 2012-08-13 ENCOUNTER — Encounter: Payer: Self-pay | Admitting: Internal Medicine

## 2012-08-13 MED ORDER — GLUCOSE BLOOD VI STRP: ORAL_STRIP | Status: DC

## 2012-08-13 MED ORDER — INSULIN LISPRO 100 UNIT/ML ~~LOC~~ SOLN: SUBCUTANEOUS | Status: DC

## 2012-08-13 NOTE — Telephone Encounter (Signed)
The patient called to report that her insurance ends on 08/14/12 and she needs to switch her Humalog and her Freestyle test strips to a different pharmacy.  The patient stated she needs these medicines called in today.  Please call the patient at [phone].

## 2012-08-16 ENCOUNTER — Telehealth (INDEPENDENT_AMBULATORY_CARE_PROVIDER_SITE_OTHER): Payer: Self-pay | Admitting: General Surgery

## 2012-08-16 NOTE — Telephone Encounter (Signed)
Called Dr. Haywood Pao office and spoke with Katheran James. Advised we needed all of the notes from Dr. Elnoria Howard regarding this patient in addition to the name of the urologist that saw her for kidney stones. Lupita Leash advised the note sent was the only one because the patient was seen as a new patient, then referred to our office. Lupita Leash said she will call the patient and ask her to call me back to give the name of the urologist in order to request their notes and test information as well.

## 2012-08-16 NOTE — Telephone Encounter (Signed)
Patient called to advise she has been seen by Dr. Vernie Ammons at [facility] regarding her kidney stones. I called the facility and rep in medical records stated the patient was last seen 06/06/12 and would need to sign a release since they did not refer her to our facility.  I called the patient to advise of the need for her to authorize release of information. Patient given the number to [facility] directly. Patient stated she would call them.

## 2012-08-17 ENCOUNTER — Ambulatory Visit (INDEPENDENT_AMBULATORY_CARE_PROVIDER_SITE_OTHER): Payer: PRIVATE HEALTH INSURANCE | Admitting: General Surgery

## 2012-08-23 ENCOUNTER — Encounter (INDEPENDENT_AMBULATORY_CARE_PROVIDER_SITE_OTHER): Payer: Self-pay | Admitting: General Surgery

## 2012-08-23 ENCOUNTER — Ambulatory Visit (INDEPENDENT_AMBULATORY_CARE_PROVIDER_SITE_OTHER): Payer: BC Managed Care – PPO | Admitting: General Surgery

## 2012-08-23 VITALS — BP 110/72 | HR 80 | Temp 98.7°F | Resp 16 | Ht 67.0 in | Wt 180.2 lb

## 2012-08-23 DIAGNOSIS — R109 Unspecified abdominal pain: Secondary | ICD-10-CM

## 2012-08-23 NOTE — Patient Instructions (Signed)
You have numerous symptoms, but I do not think that these are due to gallbladder disease.  Your symptoms are not typical of gallbladder disease. All of your gallbladder x-rays are normal. There is no family history of gallbladder disease.  I do not think that you would benefit from a gallbladder operation, nor do I think that your symptoms would resolve  You are referred back to Dr. Elnoria Howard and to Dr. Everardo All for further evaluation and management of your right flank pain.

## 2012-08-23 NOTE — Progress Notes (Signed)
Patient ID: Marie Cook, female   DOB: 1985-11-25, 27 y.o.   MRN: [id number]  Chief Complaint  Patient presents with  . New Evaluation    eval bilary dysk    HPI Marie Cook is a 27 y.o. female.  Marie Cook is referred to me by Dr. Jeani Hawking to see if I think that Marie Cook would benefit from a gallbladder operation.  This patient is a pleasant but extremely talkative type I diabetic followed by Romero Belling for primary care and Dr. Assunta Found for endocrinology.  In 2008 Marie Cook had an episode of severe pyelonephritis requiring hospitalization. Marie Cook thinks this was her or her right kidney. Dr. Vernie Ammons says that Marie Cook had microabscesses.  More recently Marie Cook's had some back pain and right flank pain. This is not related to meals. Marie Cook states Marie Cook can eat anything Marie Cook wants. Marie Cook doesn't have any nausea or vomiting. And directly questioning, Marie Cook states that Marie Cook does not have abdominal pain. Marie Cook did have some reflux once after eating a large bowl of chili. Liver function tests have been repeatedly normal. WBC was 21,000 on 06/25/2012, but that was not explained and was never repeated. Gallbladder ultrasound on 06/01/2012 is normal. The gallbladder looked normal. There may be a small hemangioma of the liver. CCK stimulated hepatobiliary scan performed on 07/26/2012 is normal. Marie Cook had no symptoms with CCK infusion. Marie Cook has had upper endoscopy recently by Dr. Elnoria Howard  which is negative. Marie Cook has seen Dr. Vernie Ammons on occasion, most recently on 06/05/2012 and he knows that Marie Cook has small stones in her right kidney which are probably not causing any pain and chronic pyelonephritis.  Family history is negative for biliary tract or colonic disease.  Throughout the encounter and conversation Marie Cook states that Marie Cook is convinced that Marie Cook has gallbladder disease. Marie Cook is also anxious about whether Marie Cook might have MRSA in her blood Marie Cook is also concerned about whether this might be heart disease and should Marie Cook go to a cardiologist.  Marie Cook is  very pleasant and in no distress today. HPI  Past Medical History  Diagnosis Date  . HYPERCHOLESTEROLEMIA 08/26/2008  . UNSPECIFIED ANEMIA 10/05/2007  . Anxiety state, unspecified 08/26/2008  . HYPOKALEMIA 07/07/2009  . Pyelonephritis, chronic   . Pneumonia   . MRSA infection     chronic  . Abdominal pain   . Chest pain   . DIABETES MELLITUS, TYPE I 09/1992    Type 1  . HYPOTHYROIDISM 08/26/2008  . Acute pyelonephritis without lesion of renal medullary necrosis   . Fever   . Calculus of kidney     possible per [facility] dated 06/05/12    Past Surgical History  Procedure Date  . Edg 12/07/2006  . Cesarean section 2011  . Wisdom tooth extraction 2008    Family History  Problem Relation Age of Onset  . Colon cancer Neg Hx   . Breast cancer Neg Hx     Social History History  Substance Use Topics  . Smoking status: Never Smoker   . Smokeless tobacco: Never Used  . Alcohol Use: No    Allergies  Allergen Reactions  . Latex   . Oseltamivir Phosphate     Current Outpatient Prescriptions  Medication Sig Dispense Refill  . glucagon (GLUCAGON EMERGENCY) 1 MG injection As directed       . glucose blood (FREESTYLE LITE) test strip 12/day dx 250.03, variable glucoses  1080 each  3  . insulin lispro (HUMALOG) 100 UNIT/ML injection For use in pump,  total of 120 units/day  120 mL  3  . Insulin Pump Disposable (OMNIPOD) MISC 1 pod every day  90 each  3  . Lancets (ONETOUCH ULTRASOFT) lancets 12/day. Variable glucoses. Dx 250.01       . levothyroxine (SYNTHROID, LEVOTHROID) 137 MCG tablet Take 137 mcg by mouth daily.      . norethindrone (MICRONOR,CAMILA,ERRIN) 0.35 MG tablet Take 1 tablet by mouth daily.      Marland Kitchen acyclovir (ZOVIRAX) 800 MG tablet 1 pill, 5x a day  35 tablet  0  . AZURETTE 0.15-0.02/0.01 MG (21/5) tablet Take 1 tablet by mouth daily.       . cyanocobalamin (,VITAMIN B-12,) 1000 MCG/ML injection Inject 1,000 mcg into the muscle every 30 (thirty) days.      .  nitrofurantoin, macrocrystal-monohydrate, (MACROBID) 100 MG capsule Take 100 mg by mouth 2 (two) times daily.      . Urine Glucose-Ketones Test (KETO-DIASTIX) STRP As needed use-1 box         Review of Systems Review of Systems  Constitutional: Positive for chills. Negative for fever, appetite change and unexpected weight change.  HENT: Negative for hearing loss, congestion, sore throat, trouble swallowing and voice change.   Eyes: Negative for visual disturbance.  Respiratory: Negative for cough, shortness of breath and wheezing.   Cardiovascular: Positive for chest pain. Negative for palpitations and leg swelling.  Gastrointestinal: Negative for nausea, vomiting, abdominal pain, diarrhea, constipation, blood in stool, abdominal distention and anal bleeding.  Genitourinary: Positive for flank pain. Negative for hematuria, vaginal bleeding and difficulty urinating.  Musculoskeletal: Positive for back pain. Negative for arthralgias.  Skin: Negative for rash and wound.  Neurological: Negative for seizures, syncope and headaches.  Hematological: Negative for adenopathy. Does not bruise/bleed easily.  Psychiatric/Behavioral: Negative for confusion.    Blood pressure 110/72, pulse 80, temperature 98.7 F (37.1 C), temperature source Temporal, resp. rate 16, height 5\' 7"  (1.702 m), weight 180 lb 3.2 oz (81.738 kg), last menstrual period 07/05/2012.  Physical Exam Physical Exam  Constitutional: Marie Cook is oriented to person, place, and time. Marie Cook appears well-developed and well-nourished. No distress.  HENT:  Head: Normocephalic and atraumatic.  Nose: Nose normal.  Mouth/Throat: No oropharyngeal exudate.  Eyes: Conjunctivae normal and EOM are normal. Pupils are equal, round, and reactive to light. Left eye exhibits no discharge. No scleral icterus.  Neck: Neck supple. No JVD present. No tracheal deviation present. No thyromegaly present.  Cardiovascular: Normal rate, regular rhythm, normal heart  sounds and intact distal pulses.   No murmur heard. Pulmonary/Chest: Effort normal and breath sounds normal. No respiratory distress. Marie Cook has no wheezes. Marie Cook has no rales. Marie Cook exhibits no tenderness.  Abdominal: Soft. Bowel sounds are normal. Marie Cook exhibits no distension and no mass. There is no tenderness. There is no rebound and no guarding.       Pfannenstiel incision. Abdomen soft and nondistended. Right upper quadrant and epigastrium nontender.right costal margin and sternum nontender  Musculoskeletal: Marie Cook exhibits no edema and no tenderness.  Lymphadenopathy:    Marie Cook has no cervical adenopathy.  Neurological: Marie Cook is alert and oriented to person, place, and time. Marie Cook exhibits normal muscle tone. Coordination normal.  Skin: Skin is warm. No rash noted. Marie Cook is not diaphoretic. No erythema. No pallor.  Psychiatric: Marie Cook has a normal mood and affect. Her behavior is normal. Judgment and thought content normal.    Data Reviewed Office notes from Dr. Elnoria Howard and Dr. Talmage Nap and Dr. Everardo All. I reviewed her upper  endoscopy report, ultrasound report, and hepatobiliary scan report.  Assessment    Back pain and right flank pain of uncertain etiology. Her history is in consistent with biliary colic or gallbladder attacks. Her imaging studies of her biliary tract and gallbladder are all normal. Her family history is negative for biliary tract disease. There is no evidence that Marie Cook has gallbladder disease and there is no indication for surgical intervention. I discussed this with her.  Type 1 diabetes  Nonobstructing right kidney stone  History of pyelonephritis  Hypothyroid, on Synthroid supplementation      Plan    I advised the patient of my clinical opinion, and I advised against cholecystectomy. I explained to her that this would be unlikely to resolve her symptoms given her extensive workup and negative findings.  Marie Cook is referred back to her gastroenterologist and her primary care physician  for further evaluation and management.  I told her that it was possible that Marie Cook may develop gallbladder disease in the future.       Angelia Mould. Derrell Lolling, M.D., [facility] General and Minimally invasive Surgery Breast and Colorectal Surgery Office:   [phone] Pager:   [phone]  08/23/2012, 12:53 PM

## 2012-08-27 ENCOUNTER — Encounter (HOSPITAL_COMMUNITY): Payer: Self-pay

## 2012-08-27 DIAGNOSIS — Z8701 Personal history of pneumonia (recurrent): Secondary | ICD-10-CM | POA: Insufficient documentation

## 2012-08-27 DIAGNOSIS — F411 Generalized anxiety disorder: Secondary | ICD-10-CM | POA: Insufficient documentation

## 2012-08-27 DIAGNOSIS — IMO0001 Reserved for inherently not codable concepts without codable children: Secondary | ICD-10-CM | POA: Insufficient documentation

## 2012-08-27 DIAGNOSIS — Z8639 Personal history of other endocrine, nutritional and metabolic disease: Secondary | ICD-10-CM | POA: Insufficient documentation

## 2012-08-27 DIAGNOSIS — E039 Hypothyroidism, unspecified: Secondary | ICD-10-CM | POA: Insufficient documentation

## 2012-08-27 DIAGNOSIS — R1310 Dysphagia, unspecified: Secondary | ICD-10-CM | POA: Insufficient documentation

## 2012-08-27 DIAGNOSIS — Z87442 Personal history of urinary calculi: Secondary | ICD-10-CM | POA: Insufficient documentation

## 2012-08-27 DIAGNOSIS — R5381 Other malaise: Secondary | ICD-10-CM | POA: Insufficient documentation

## 2012-08-27 DIAGNOSIS — Z794 Long term (current) use of insulin: Secondary | ICD-10-CM | POA: Insufficient documentation

## 2012-08-27 DIAGNOSIS — R6883 Chills (without fever): Secondary | ICD-10-CM | POA: Insufficient documentation

## 2012-08-27 DIAGNOSIS — E109 Type 1 diabetes mellitus without complications: Secondary | ICD-10-CM | POA: Insufficient documentation

## 2012-08-27 DIAGNOSIS — R0602 Shortness of breath: Secondary | ICD-10-CM | POA: Insufficient documentation

## 2012-08-27 DIAGNOSIS — Z862 Personal history of diseases of the blood and blood-forming organs and certain disorders involving the immune mechanism: Secondary | ICD-10-CM | POA: Insufficient documentation

## 2012-08-27 DIAGNOSIS — Z8614 Personal history of Methicillin resistant Staphylococcus aureus infection: Secondary | ICD-10-CM | POA: Insufficient documentation

## 2012-08-27 DIAGNOSIS — Z87448 Personal history of other diseases of urinary system: Secondary | ICD-10-CM | POA: Insufficient documentation

## 2012-08-27 DIAGNOSIS — R0789 Other chest pain: Secondary | ICD-10-CM | POA: Insufficient documentation

## 2012-08-27 DIAGNOSIS — E785 Hyperlipidemia, unspecified: Secondary | ICD-10-CM | POA: Insufficient documentation

## 2012-08-27 DIAGNOSIS — Z79899 Other long term (current) drug therapy: Secondary | ICD-10-CM | POA: Insufficient documentation

## 2012-08-27 DIAGNOSIS — R110 Nausea: Secondary | ICD-10-CM | POA: Insufficient documentation

## 2012-08-27 LAB — CBC
HCT: 38.7 % (ref 36.0–46.0)
Hemoglobin: 13.4 g/dL (ref 12.0–15.0)
MCHC: 34.6 g/dL (ref 30.0–36.0)
MCV: 84.9 fL (ref 78.0–100.0)
RDW: 12.8 % (ref 11.5–15.5)

## 2012-08-27 LAB — POCT I-STAT TROPONIN I

## 2012-08-27 LAB — BASIC METABOLIC PANEL
BUN: 10 mg/dL (ref 6–23)
Chloride: 99 mEq/L (ref 96–112)
Creatinine, Ser: 0.77 mg/dL (ref 0.50–1.10)
Glucose, Bld: 143 mg/dL — ABNORMAL HIGH (ref 70–99)
Potassium: 3.2 mEq/L — ABNORMAL LOW (ref 3.5–5.1)

## 2012-08-27 NOTE — ED Notes (Signed)
Pt reports mid-sternum chest pain radiating up into her throat, heart palpitations, SOB, nausea, weakness, diaphoretic, and weakness, pt reports her entire upper body feels achy. Pt has been seen at [facility] for possible gall stones and her kidney dr w/no relief. Pt tearful in triage

## 2012-08-28 ENCOUNTER — Emergency Department (HOSPITAL_COMMUNITY)
Admit: 2012-08-28 | Discharge: 2012-08-28 | Disposition: A | Discharge status: Home / Self Care | Payer: BC Managed Care – PPO | Attending: Emergency Medicine | Admitting: Emergency Medicine

## 2012-08-28 ENCOUNTER — Emergency Department (HOSPITAL_COMMUNITY)
Admission: EM | Admit: 2012-08-28 | Discharge: 2012-08-28 | Disposition: A | Discharge status: Home / Self Care | Payer: BC Managed Care – PPO | Attending: Emergency Medicine | Admitting: Emergency Medicine

## 2012-08-28 DIAGNOSIS — R079 Chest pain, unspecified: Secondary | ICD-10-CM

## 2012-08-28 LAB — URINALYSIS, ROUTINE W REFLEX MICROSCOPIC
Bilirubin Urine: NEGATIVE
Glucose, UA: 100 mg/dL — AB
Hgb urine dipstick: NEGATIVE
Specific Gravity, Urine: 1.013 (ref 1.005–1.030)
pH: 6 (ref 5.0–8.0)

## 2012-08-28 LAB — URINE MICROSCOPIC-ADD ON

## 2012-08-28 LAB — HEPATIC FUNCTION PANEL
ALT: 12 U/L (ref 0–35)
AST: 12 U/L (ref 0–37)
Albumin: 3.7 g/dL (ref 3.5–5.2)

## 2012-08-28 LAB — D-DIMER, QUANTITATIVE: D-Dimer, Quant: <0.27 ug/mL-FEU (ref 0.00–0.48)

## 2012-08-28 MED ORDER — SUCRALFATE 1 GM/10ML PO SUSP
1.0000 g | Freq: Once | ORAL | Status: AC
  Administered 2012-08-28: 1 g via ORAL
  Filled 2012-08-28: qty 10

## 2012-08-28 MED ORDER — ACETAMINOPHEN 325 MG PO TABS
650.0000 mg | ORAL_TABLET | Freq: Once | ORAL | Status: AC
  Administered 2012-08-28: 650 mg via ORAL
  Filled 2012-08-28: qty 2

## 2012-08-28 NOTE — ED Notes (Signed)
Nurse went in to collect urine specimen, pt tearful stating "im in pain all over" Nurse offered to speak with provider about pain medication, but pt refused at this time.

## 2012-08-28 NOTE — ED Provider Notes (Signed)
History     CSN: [id number]  Arrival date & time 08/27/12  2038   First MD Initiated Contact with Patient 08/28/12 0055      Chief Complaint  Patient presents with  . Chest Pain    (Consider location/radiation/quality/duration/timing/severity/associated sxs/prior treatment) HPI Comments: Pt states that her symptoms started about 3 months ago when she would become extremely short of breath and feel that she had pressure on her chest.  She states that she does have chest pain almost all of the time and that she feels that it is severe.  She denies any family history or personal history of lung or heart problems.   She also states that she is extremely weak and unable to move around normally without assistance and has diffuse pain throughout her body.  She feels that she always has the chest pain, but that the shortness of breath, difficulty breathing, and body aches come and go in "attack" like fashion.  Patient is a 27 y.o. female presenting with chest pain. The history is provided by the patient.  Chest Pain The chest pain began more  than 1 month ago. Chest pain occurs intermittently. The chest pain is unchanged. The pain is associated with breathing. The severity of the pain is moderate. The quality of the pain is described as pressure-like. Primary symptoms include fatigue, shortness of breath and nausea. Pertinent negatives for primary symptoms include no fever, no syncope, no cough, no wheezing, no palpitations, no abdominal pain, no vomiting and no dizziness.  The fatigue began more than 1 week ago. The fatigue has been unchanged since its onset.  The shortness of breath began more than 2 days ago. The shortness of breath developed gradually. The shortness of breath is severe. The patient's medical history does not include CHF, COPD, asthma or chronic lung disease.  Nausea began today.   Pertinent negatives for associated symptoms include no diaphoresis. Risk factors include oral  contraceptive use.  Her past medical history is significant for diabetes and thyroid problem.  Her family medical history is significant for CAD in family and heart disease in family.     Past Medical History  Diagnosis Date  . HYPERCHOLESTEROLEMIA 08/26/2008  . UNSPECIFIED ANEMIA 10/05/2007  . Anxiety state, unspecified 08/26/2008  . HYPOKALEMIA 07/07/2009  . Pyelonephritis, chronic   . Pneumonia   . MRSA infection     chronic  . Abdominal pain   . Chest pain   . DIABETES MELLITUS, TYPE I 09/1992    Type 1  . HYPOTHYROIDISM 08/26/2008  . Acute pyelonephritis without lesion of renal medullary necrosis   . Fever   . Calculus of kidney     possible per [facility] dated 06/05/12    Past Surgical History  Procedure Date  . Edg 12/07/2006  . Cesarean section 2011  . Wisdom tooth extraction 2008    Family History  Problem Relation Age of Onset  . Colon cancer Neg Hx   . Breast cancer Neg Hx     History  Substance Use Topics  . Smoking status: Never Smoker   . Smokeless tobacco: Never Used  . Alcohol Use: No    OB History    Grav Para Term Preterm Abortions TAB SAB Ect Mult Living                  Review of Systems  Constitutional: Positive for chills, activity change and fatigue. Negative for fever, diaphoresis and appetite change.  HENT: Positive for trouble  swallowing.        PT states that she feels like she has trouble swallowing due to the feeling of being unable to breathe and the chest pressure.  Eyes: Negative.   Respiratory: Positive for chest tightness and shortness of breath. Negative for cough, choking, wheezing and stridor.   Cardiovascular: Positive for chest pain. Negative for palpitations, leg swelling and syncope.  Gastrointestinal: Positive for nausea. Negative for vomiting, abdominal pain, diarrhea, constipation and abdominal distention.  Genitourinary: Negative.   Musculoskeletal: Positive for myalgias and arthralgias.  Skin: Negative.     Neurological: Negative.  Negative for dizziness.  All other systems reviewed and are negative.    Allergies  Latex and Oseltamivir phosphate  Home Medications   Current Outpatient Rx  Name  Route  Sig  Dispense  Refill  . GLUCOSE BLOOD VI STRP      12/day dx 250.03, variable glucoses   1080 each   3   . INSULIN LISPRO (HUMAN) 100 UNIT/ML [place] SOLN   Subcutaneous   Inject into the skin continuous. Insulin pump         . OMNIPOD MISC      1 pod every day   90 each   3   . ONETOUCH ULTRASOFT LANCETS MISC      12/day. Variable glucoses. Dx 250.01          . LEVOTHYROXINE SODIUM 125 MCG PO TABS   Oral   Take 125 mcg by mouth daily.         . NORETHINDRONE 0.35 MG PO TABS   Oral   Take 1 tablet by mouth daily.         Marland Kitchen KETO-DIASTIX VI STRP      As needed use-1 box            BP 101/71  Pulse 114  Temp 99.6 F (37.6 C) (Oral)  Resp 25  SpO2 98%  LMP 08/12/2012  Physical Exam  Nursing note and vitals reviewed. Constitutional: She is oriented to person, place, and time. She appears well-developed and well-nourished. No distress.  HENT:  Head: Normocephalic and atraumatic.  Eyes: Conjunctivae normal are normal. Pupils are equal, round, and reactive to light.  Neck: Normal range of motion. Neck supple. No tracheal deviation present. No thyromegaly present.  Cardiovascular: Normal rate, regular rhythm and normal heart sounds.   Pulmonary/Chest: Effort normal and breath sounds normal. No accessory muscle usage or stridor. Not tachypneic. No respiratory distress. She has no wheezes. She exhibits tenderness.       Pt states that she has trouble breathing, but on physical exam she was not using accessory muscles to breathe and both lungs were clear to auscultation.  Her chest was tender to palpation and she commented that it made her feel like "she couldn't breathe" when I palpated her.  Abdominal: Soft. Bowel sounds are normal.  Musculoskeletal: Normal  range of motion. She exhibits no edema.  Lymphadenopathy:    She has no cervical adenopathy.  Neurological: She is alert and oriented to person, place, and time.  Skin: Skin is warm and dry. She is not diaphoretic.  Psychiatric: She has a normal mood and affect.    ED Course  Procedures (including critical care time)  Labs Reviewed  CBC - Abnormal; Notable for the following:    WBC 16.4 (*)     All other components within normal limits  BASIC METABOLIC PANEL - Abnormal; Notable for the following:    Potassium 3.2 (*)  Glucose, Bld 143 (*)     All other components within normal limits  URINALYSIS, ROUTINE W REFLEX MICROSCOPIC - Abnormal; Notable for the following:    APPearance CLOUDY (*)     Glucose, UA 100 (*)     Ketones, ur 15 (*)     Leukocytes, UA MODERATE (*)     All other components within normal limits  URINE MICROSCOPIC-ADD ON - Abnormal; Notable for the following:    Squamous Epithelial / LPF FEW (*)     Bacteria, UA FEW (*)     All other components within normal limits  PRO B NATRIURETIC PEPTIDE  POCT I-STAT TROPONIN I  D-DIMER, QUANTITATIVE  HEPATIC FUNCTION PANEL  URINE CULTURE   Dg Chest 2 View  08/28/2012  *RADIOLOGY REPORT*  Clinical Data: Chest tightness, pain.  CHEST - 2 VIEW  Comparison: 09/17/2008  Findings: Lungs clear.  Heart size and pulmonary vascularity normal.  No effusion.  Visualized bones unremarkable.  IMPRESSION: No acute disease   Original Report Authenticated By: D. Andria Rhein, MD      1. Nonspecific chest pain       MDM  We evaluated and considered cardiac disease, gall bladder disease, pulmonary emboli all with negative results.  Patient was encouraged to keep a diary of symptoms for possible triggers and referred back to primary care physician.      Arman Filter, NP 08/28/12 [phone]

## 2012-08-29 ENCOUNTER — Encounter (INDEPENDENT_AMBULATORY_CARE_PROVIDER_SITE_OTHER): Payer: Self-pay

## 2012-08-29 LAB — URINE CULTURE

## 2012-08-29 NOTE — ED Provider Notes (Signed)
Medical screening examination/treatment/procedure(s) were performed by non-physician practitioner and as supervising physician I was immediately available for consultation/collaboration.   Gilda Crease, MD 08/29/12 [phone]

## 2012-09-03 ENCOUNTER — Encounter (INDEPENDENT_AMBULATORY_CARE_PROVIDER_SITE_OTHER): Payer: Self-pay

## 2012-09-05 ENCOUNTER — Other Ambulatory Visit (HOSPITAL_COMMUNITY): Payer: Self-pay | Admitting: Obstetrics and Gynecology

## 2012-09-05 DIAGNOSIS — R079 Chest pain, unspecified: Secondary | ICD-10-CM

## 2012-09-06 ENCOUNTER — Ambulatory Visit (HOSPITAL_COMMUNITY): Admission: RE | Admit: 2012-09-06 | Payer: PRIVATE HEALTH INSURANCE | Source: Ambulatory Visit

## 2012-09-07 ENCOUNTER — Encounter (INDEPENDENT_AMBULATORY_CARE_PROVIDER_SITE_OTHER): Payer: Self-pay

## 2012-09-07 ENCOUNTER — Ambulatory Visit (HOSPITAL_COMMUNITY): Payer: PRIVATE HEALTH INSURANCE

## 2012-09-10 ENCOUNTER — Other Ambulatory Visit (HOSPITAL_COMMUNITY): Payer: Self-pay | Admitting: Obstetrics and Gynecology

## 2012-09-10 ENCOUNTER — Ambulatory Visit (HOSPITAL_COMMUNITY): Admission: RE | Admit: 2012-09-10 | Payer: BC Managed Care – PPO | Source: Ambulatory Visit

## 2012-09-10 DIAGNOSIS — R109 Unspecified abdominal pain: Secondary | ICD-10-CM

## 2012-09-10 DIAGNOSIS — R0602 Shortness of breath: Secondary | ICD-10-CM

## 2012-09-10 DIAGNOSIS — R079 Chest pain, unspecified: Secondary | ICD-10-CM

## 2012-09-10 DIAGNOSIS — M549 Dorsalgia, unspecified: Secondary | ICD-10-CM

## 2012-09-10 DIAGNOSIS — R509 Fever, unspecified: Secondary | ICD-10-CM

## 2012-09-11 ENCOUNTER — Other Ambulatory Visit (HOSPITAL_COMMUNITY): Payer: Self-pay | Admitting: Obstetrics and Gynecology

## 2012-09-11 ENCOUNTER — Ambulatory Visit (HOSPITAL_COMMUNITY)
Admission: RE | Admit: 2012-09-11 | Discharge: 2012-09-11 | Disposition: A | Discharge status: Home / Self Care | Payer: BC Managed Care – PPO | Source: Ambulatory Visit | Attending: Obstetrics and Gynecology | Admitting: Obstetrics and Gynecology

## 2012-09-11 ENCOUNTER — Encounter (HOSPITAL_COMMUNITY): Payer: Self-pay

## 2012-09-11 DIAGNOSIS — R079 Chest pain, unspecified: Secondary | ICD-10-CM

## 2012-09-11 DIAGNOSIS — R0602 Shortness of breath: Secondary | ICD-10-CM

## 2012-09-11 DIAGNOSIS — R109 Unspecified abdominal pain: Secondary | ICD-10-CM

## 2012-09-11 DIAGNOSIS — R509 Fever, unspecified: Secondary | ICD-10-CM

## 2012-09-11 DIAGNOSIS — N059 Unspecified nephritic syndrome with unspecified morphologic changes: Secondary | ICD-10-CM | POA: Insufficient documentation

## 2012-09-11 DIAGNOSIS — R05 Cough: Secondary | ICD-10-CM | POA: Insufficient documentation

## 2012-09-11 DIAGNOSIS — R059 Cough, unspecified: Secondary | ICD-10-CM | POA: Insufficient documentation

## 2012-09-11 DIAGNOSIS — M549 Dorsalgia, unspecified: Secondary | ICD-10-CM

## 2012-09-11 MED ORDER — IOHEXOL 300 MG/ML  SOLN
100.0000 mL | Freq: Once | INTRAMUSCULAR | Status: AC | PRN
  Administered 2012-09-11: 100 mL via INTRAVENOUS

## 2012-09-25 ENCOUNTER — Encounter (INDEPENDENT_AMBULATORY_CARE_PROVIDER_SITE_OTHER): Payer: Self-pay

## 2012-10-09 ENCOUNTER — Ambulatory Visit (INDEPENDENT_AMBULATORY_CARE_PROVIDER_SITE_OTHER): Payer: BC Managed Care – PPO | Admitting: Infectious Diseases

## 2012-10-09 ENCOUNTER — Encounter: Payer: Self-pay | Admitting: Infectious Diseases

## 2012-10-09 VITALS — BP 126/83 | HR 96 | Temp 98.0°F | Ht 67.0 in | Wt 181.0 lb

## 2012-10-09 DIAGNOSIS — N119 Chronic tubulo-interstitial nephritis, unspecified: Secondary | ICD-10-CM

## 2012-10-09 LAB — CBC WITH DIFFERENTIAL/PLATELET
Eosinophils Absolute: 0.2 10*3/uL (ref 0.0–0.7)
Lymphs Abs: 3 10*3/uL (ref 0.7–4.0)
MCH: 29.3 pg (ref 26.0–34.0)
Neutro Abs: 2 10*3/uL (ref 1.7–7.7)
Neutrophils Relative %: 35 % — ABNORMAL LOW (ref 43–77)
Platelets: 314 10*3/uL (ref 150–400)
RBC: 4.47 MIL/uL (ref 3.87–5.11)
WBC: 5.7 10*3/uL (ref 4.0–10.5)

## 2012-10-09 NOTE — Progress Notes (Signed)
  Subjective:    Patient ID: Marie Cook, female    DOB: 12/15/1985, 27 y.o.   MRN: [id number]  HPI 27 yo F with hx of prev kidney infection 6 years ago . Also has hx of DM1 (x 21 yrs), and prev renal stones. Also has had MRSA infections 20x since birth of her son 2 years ago. Was seen by ID at [facility] (reassured). Has also been having abd pain, thought she had gallbladder pain. Was seen by surgery and was determined not to have gallbladder disease.  Has had episodes of chills, teeth chattering Aug 28, 2012. Had low grade temp, diffuse pain/myalgias (1 month). Has been having hip pain for 2 weeks.  Had CT scan (09-11-12) showing 1. Focal bacterial nephritis/pyelonephritis involving the anterior mid pole of the right kidney. No evidence of renal abscess or hydronephrosis.  2. Chronic bilateral renal parenchymal scarring, likely due to prior pyelonephritis. Has been having high FSGs during this period as well.  Did home preg test 10-06-12 (-) .  Review of Systems  Constitutional: Positive for chills and appetite change. Negative for fever and unexpected weight change.  Eyes: Negative for visual disturbance.  Respiratory: Negative for shortness of breath.        Feels like there is a weight on her chest, can't catch her breath.   Cardiovascular: Negative for chest pain.  Gastrointestinal: Positive for nausea. Negative for vomiting, diarrhea and constipation.  Genitourinary: Positive for flank pain. Negative for dysuria, frequency, hematuria and menstrual problem.  Musculoskeletal: Positive for myalgias, back pain and arthralgias.  Neurological: Negative for light-headedness and numbness.  Hematological: Negative for adenopathy.       Objective:   Physical Exam  Constitutional: She appears well-developed and well-nourished.  HENT:  Mouth/Throat: No oropharyngeal exudate.  Eyes: EOM are normal. Pupils are equal, round, and reactive to light.  Neck: Neck supple.  Cardiovascular: Normal rate,  regular rhythm and normal heart sounds.   Pulmonary/Chest: Effort normal and breath sounds normal.  Abdominal: Soft. Bowel sounds are normal. There is no tenderness. There is no rebound.  Musculoskeletal: Normal range of motion. She exhibits no edema and no tenderness.  Lymphadenopathy:    She has no cervical adenopathy.  Skin: No rash noted.          Assessment & Plan:

## 2012-10-09 NOTE — Assessment & Plan Note (Addendum)
Will check her UA, UCx, gc/chlamydia. RPR., HIV.  Will check her HLA for Reiter's, ANA, anca. ESR, CRP.  Her sx are somewhat unusual for chronic infection (she is not febrile, her WBC is variable).  If her labs are unremarkable will have her seen by [facility] +/or nephrology.

## 2012-10-10 LAB — URINALYSIS, ROUTINE W REFLEX MICROSCOPIC
Leukocytes, UA: NEGATIVE
Nitrite: NEGATIVE
Specific Gravity, Urine: 1.019 (ref 1.005–1.030)
Urobilinogen, UA: 0.2 mg/dL (ref 0.0–1.0)
pH: 6 (ref 5.0–8.0)

## 2012-10-10 LAB — URINE CULTURE: Colony Count: 30000

## 2012-10-10 LAB — SEDIMENTATION RATE: Sed Rate: 4 mm/hr (ref 0–22)

## 2012-10-10 LAB — C-REACTIVE PROTEIN: CRP: <0.5 mg/dL (ref <0.60)

## 2012-10-11 LAB — ANCA SCREEN W REFLEX TITER
Atypical p-ANCA Screen: NEGATIVE
c-ANCA Screen: NEGATIVE
p-ANCA Screen: NEGATIVE

## 2012-10-12 ENCOUNTER — Encounter (INDEPENDENT_AMBULATORY_CARE_PROVIDER_SITE_OTHER): Payer: Self-pay

## 2012-10-24 ENCOUNTER — Encounter: Payer: Self-pay | Admitting: Infectious Diseases

## 2012-10-24 ENCOUNTER — Ambulatory Visit (INDEPENDENT_AMBULATORY_CARE_PROVIDER_SITE_OTHER): Payer: BC Managed Care – PPO | Admitting: Infectious Diseases

## 2012-10-24 VITALS — BP 138/84 | HR 93 | Temp 98.1°F | Wt 183.0 lb

## 2012-10-24 DIAGNOSIS — N119 Chronic tubulo-interstitial nephritis, unspecified: Secondary | ICD-10-CM

## 2012-10-24 DIAGNOSIS — F411 Generalized anxiety disorder: Secondary | ICD-10-CM

## 2012-10-24 DIAGNOSIS — R079 Chest pain, unspecified: Secondary | ICD-10-CM

## 2012-10-24 NOTE — Assessment & Plan Note (Signed)
Will have her seen by renal/uro. I am not sure what is causing her symptoms, I am not able to find an infectious cause. She can return prn.

## 2012-10-24 NOTE — Assessment & Plan Note (Signed)
She describes sx of heaviness and DOE. She has FHx (father in 33s with CAD), and she is DM1. Will send her for GXT. Send her back to PCP.

## 2012-10-24 NOTE — Assessment & Plan Note (Signed)
Have not ruled out all other causes, but this is certainly on the list as the cause of her sx.

## 2012-10-24 NOTE — Progress Notes (Signed)
  Subjective:    Patient ID: Marie Cook, female    DOB: [birth year], 27 y.o.   MRN: [id number]  HPI 27 yo F with hx of prev kidney infection 6 years ago .  Also has hx of DM1 (x 21 yrs), and prev renal stones. Also has had MRSA infections 20x since birth of her son 2 years ago. Was seen by ID at [facility] (reassured). Has also been having abd pain, thought she had gallbladder pain. Was seen by surgery and was determined not to have gallbladder disease.  Has had episodes of chills, teeth chattering Aug 28, 2012. Had low grade temp, diffuse pain/myalgias (1 month). Has been having hip pain for 2 weeks. Had CT scan (09-11-12) showing 1. Focal bacterial nephritis/pyelonephritis involving the anterior mid pole of the right kidney. No evidence of renal abscess or hydronephrosis.  2. Chronic bilateral renal parenchymal scarring, likely due to prior pyelonephritis.  Has been having high FSGs during this period as well.  Did home preg test 10-06-12 (-) . Since last visit had "spell" that last 24h. Had chills, nausea, pain (all over) to the point a friend had to help her get into bed. FSG was 70-150.   After episode, still has discomfort (hips and back).   Review of Systems  Constitutional: Positive for chills. Negative for fever and unexpected weight change.  Eyes: Negative for visual disturbance.  Gastrointestinal: Negative for diarrhea and constipation.  Genitourinary: Negative for hematuria and difficulty urinating.  Musculoskeletal: Positive for myalgias and arthralgias. Negative for joint swelling.  Neurological: Negative for headaches.  Hematological: Negative for adenopathy.       Objective:   Physical Exam  Constitutional: She appears well-developed and well-nourished.  HENT:  Mouth/Throat: No oropharyngeal exudate.  Eyes: EOM are normal. Pupils are equal, round, and reactive to light.  Neck: Neck supple.  Cardiovascular: Normal rate, regular rhythm and normal heart sounds.    Pulmonary/Chest: Effort normal and breath sounds normal.  Abdominal: Soft. Bowel sounds are normal. There is no tenderness. There is no rebound.  Musculoskeletal: She exhibits no edema.  Lymphadenopathy:    She has no cervical adenopathy.          Assessment & Plan:

## 2012-10-25 ENCOUNTER — Ambulatory Visit: Payer: BC Managed Care – PPO | Admitting: Cardiovascular Disease

## 2012-11-13 ENCOUNTER — Other Ambulatory Visit: Payer: Self-pay | Admitting: Rheumatology

## 2012-11-13 DIAGNOSIS — M549 Dorsalgia, unspecified: Secondary | ICD-10-CM

## 2012-11-19 ENCOUNTER — Ambulatory Visit: Payer: BC Managed Care – PPO | Admitting: Cardiovascular Disease

## 2012-11-19 ENCOUNTER — Ambulatory Visit
Admission: RE | Admit: 2012-11-19 | Discharge: 2012-11-19 | Disposition: A | Discharge status: Home / Self Care | Payer: BC Managed Care – PPO | Source: Ambulatory Visit | Attending: Rheumatology | Admitting: Rheumatology

## 2012-11-19 DIAGNOSIS — M549 Dorsalgia, unspecified: Secondary | ICD-10-CM

## 2012-11-19 MED ORDER — GADOBENATE DIMEGLUMINE 529 MG/ML IV SOLN
17.0000 mL | Freq: Once | INTRAVENOUS | Status: AC | PRN
  Administered 2012-11-19: 17 mL via INTRAVENOUS

## 2012-11-26 ENCOUNTER — Other Ambulatory Visit: Payer: Self-pay | Admitting: Obstetrics and Gynecology

## 2012-12-10 ENCOUNTER — Ambulatory Visit (INDEPENDENT_AMBULATORY_CARE_PROVIDER_SITE_OTHER): Payer: BC Managed Care – PPO | Admitting: Cardiovascular Disease

## 2012-12-10 VITALS — BP 115/83 | HR 88 | Ht 67.0 in | Wt 180.0 lb

## 2012-12-10 DIAGNOSIS — E109 Type 1 diabetes mellitus without complications: Secondary | ICD-10-CM

## 2012-12-10 DIAGNOSIS — R0609 Other forms of dyspnea: Secondary | ICD-10-CM

## 2012-12-10 DIAGNOSIS — N119 Chronic tubulo-interstitial nephritis, unspecified: Secondary | ICD-10-CM

## 2012-12-10 DIAGNOSIS — R0989 Other specified symptoms and signs involving the circulatory and respiratory systems: Secondary | ICD-10-CM

## 2012-12-10 DIAGNOSIS — R079 Chest pain, unspecified: Secondary | ICD-10-CM

## 2012-12-10 DIAGNOSIS — E7800 Pure hypercholesterolemia, unspecified: Secondary | ICD-10-CM

## 2012-12-10 DIAGNOSIS — R0600 Dyspnea, unspecified: Secondary | ICD-10-CM

## 2012-12-10 NOTE — Assessment & Plan Note (Signed)
Would seem that she has an anatomical issue ( reflux ) that causes recurrent pylo and has seen urology.  Given constellation of symptoms and dyspnea will order echo to r/o SBE

## 2012-12-10 NOTE — Patient Instructions (Signed)

## 2012-12-10 NOTE — Assessment & Plan Note (Signed)
Discussed low carb diet.  Target hemoglobin A1c is 6.5 or less.  Continue current medications.  

## 2012-12-10 NOTE — Progress Notes (Signed)
Patient ID: Marie Cook, female   DOB: [birth year], 27 y.o.   MRN: [id number] 27 yo long standing diabetic.  Has had major issues with recurrent pylonephritis and seems frustrated by inconsistancy of care. Referred by Dr Ninetta Lights Recent UTI.  CT documented pylo 1/14.  Chronic fatigue with dyspnea and some low grade fevers.  Has not had echo Says BC;s have been negative in past.  No chest pain.  No history of murmurs.  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  Some back and abdominal pain but w/u for GB disease was negative   ROS: Denies fever, malais, weight loss, blurry vision, decreased visual acuity, cough, sputum, SOB, hemoptysis, pleuritic pain, palpitaitons, heartburn, abdominal pain, melena, lower extremity edema, claudication, or rash.  All other systems reviewed and negative   General: Affect appropriate Healthy:  appears stated age HEENT: normal Neck supple with no adenopathy JVP normal no bruits no thyromegaly Lungs clear with no wheezing and good diaphragmatic motion Heart:  S1/S2 no murmur,rub, gallop or click PMI normal Abdomen: benighn, BS positve, no tenderness, no AAA no bruit.  No HSM or HJR Distal pulses intact with no bruits No edema Neuro non-focal Skin warm and dry No muscular weakness  Medications Current Outpatient Prescriptions  Medication Sig Dispense Refill  . glucose blood (FREESTYLE LITE) test strip 12/day dx 250.03, variable glucoses  1080 each  3  . insulin lispro (HUMALOG) 100 UNIT/ML injection Inject into the skin continuous. Insulin pump      . Insulin Pump Disposable (OMNIPOD) MISC 1 pod every day  90 each  3  . Lancets (ONETOUCH ULTRASOFT) lancets 12/day. Variable glucoses. Dx 250.01       . levothyroxine (SYNTHROID, LEVOTHROID) 112 MCG tablet Take 112 mcg by mouth daily before breakfast.      . norethindrone (MICRONOR,CAMILA,ERRIN) 0.35 MG tablet Take 1 tablet by mouth daily.       No current facility-administered medications for this visit.     Allergies Latex and Oseltamivir phosphate  Family History: Family History  Problem Relation Age of Onset  . Colon cancer Neg Hx   . Breast cancer Neg Hx   . Heart disease Father     Social History: History   Social History  . Marital Status: Married    Spouse Name: N/A    Number of Children: 1  . Years of Education: N/A   Occupational History  . homemaker    Social History Main Topics  . Smoking status: Never Smoker   . Smokeless tobacco: Never Used  . Alcohol Use: No  . Drug Use: No  . Sexually Active: Not on file   Other Topics Concern  . Not on file   Social History Narrative  . No narrative on file    Electrocardiogram:  NSR rate 88 normal  Assessment and Plan

## 2012-12-10 NOTE — Assessment & Plan Note (Signed)
LDL 86 Diet control Despite DM very young to be on chronic statin

## 2012-12-12 ENCOUNTER — Ambulatory Visit: Payer: BC Managed Care – PPO | Admitting: Physical Therapy

## 2012-12-18 ENCOUNTER — Other Ambulatory Visit (HOSPITAL_COMMUNITY): Payer: BC Managed Care – PPO

## 2013-01-09 ENCOUNTER — Ambulatory Visit (HOSPITAL_COMMUNITY): Payer: BC Managed Care – PPO | Attending: Cardiovascular Disease | Admitting: Radiology

## 2013-01-09 ENCOUNTER — Ambulatory Visit: Payer: BC Managed Care – PPO | Attending: Obstetrics and Gynecology | Admitting: Physical Therapy

## 2013-01-09 DIAGNOSIS — R0989 Other specified symptoms and signs involving the circulatory and respiratory systems: Secondary | ICD-10-CM

## 2013-01-09 DIAGNOSIS — M629 Disorder of muscle, unspecified: Secondary | ICD-10-CM | POA: Insufficient documentation

## 2013-01-09 DIAGNOSIS — R0609 Other forms of dyspnea: Secondary | ICD-10-CM | POA: Insufficient documentation

## 2013-01-09 DIAGNOSIS — R0600 Dyspnea, unspecified: Secondary | ICD-10-CM

## 2013-01-09 DIAGNOSIS — IMO0001 Reserved for inherently not codable concepts without codable children: Secondary | ICD-10-CM | POA: Insufficient documentation

## 2013-01-09 DIAGNOSIS — M2420 Disorder of ligament, unspecified site: Secondary | ICD-10-CM | POA: Insufficient documentation

## 2013-01-09 NOTE — Progress Notes (Signed)
Echocardiogram performed.  

## 2013-01-14 ENCOUNTER — Telehealth: Payer: Self-pay | Admitting: Cardiovascular Disease

## 2013-01-14 NOTE — Telephone Encounter (Signed)
Follow Up ° ° ° ° °Returning phone call from earlier. Please call back. °

## 2013-01-14 NOTE — Telephone Encounter (Signed)
PT AWARE OF ECHO RESULTS./CY 

## 2013-06-03 ENCOUNTER — Encounter (HOSPITAL_COMMUNITY): Payer: BC Managed Care – PPO

## 2013-06-05 ENCOUNTER — Encounter (HOSPITAL_COMMUNITY): Payer: BC Managed Care – PPO

## 2013-06-06 ENCOUNTER — Other Ambulatory Visit (HOSPITAL_COMMUNITY): Payer: Self-pay | Admitting: *Deleted

## 2013-06-06 ENCOUNTER — Encounter (HOSPITAL_COMMUNITY): Payer: BC Managed Care – PPO

## 2013-06-07 ENCOUNTER — Encounter (HOSPITAL_COMMUNITY)
Admission: RE | Admit: 2013-06-07 | Discharge: 2013-06-07 | Disposition: A | Discharge status: Home / Self Care | Payer: BC Managed Care – PPO | Source: Ambulatory Visit | Attending: Obstetrics and Gynecology | Admitting: Obstetrics and Gynecology

## 2013-06-07 MED ORDER — FERUMOXYTOL INJECTION 510 MG/17 ML
510.0000 mg | INTRAVENOUS | Status: DC
  Administered 2013-06-07: 510 mg via INTRAVENOUS

## 2013-06-07 MED ORDER — SODIUM CHLORIDE 0.9 % IV SOLN
INTRAVENOUS | Status: DC
  Administered 2013-06-07: 12:00:00 via INTRAVENOUS

## 2013-06-07 MED ORDER — FERUMOXYTOL INJECTION 510 MG/17 ML
INTRAVENOUS | Status: AC
  Filled 2013-06-07: qty 17

## 2013-06-10 ENCOUNTER — Encounter (HOSPITAL_COMMUNITY)
Admission: RE | Admit: 2013-06-10 | Discharge: 2013-06-10 | Disposition: A | Discharge status: Home / Self Care | Payer: BC Managed Care – PPO | Source: Ambulatory Visit | Attending: Obstetrics and Gynecology | Admitting: Obstetrics and Gynecology

## 2013-06-10 MED ORDER — FERUMOXYTOL INJECTION 510 MG/17 ML
INTRAVENOUS | Status: AC
  Administered 2013-06-10: 510 mg via INTRAVENOUS
  Filled 2013-06-10: qty 17

## 2013-06-10 MED ORDER — FERUMOXYTOL INJECTION 510 MG/17 ML: 510.0000 mg | INTRAVENOUS | Status: AC

## 2013-06-10 MED ORDER — SODIUM CHLORIDE 0.9 % IV SOLN
INTRAVENOUS | Status: AC
  Administered 2013-06-10: 11:00:00 via INTRAVENOUS

## 2013-06-20 ENCOUNTER — Other Ambulatory Visit: Payer: Self-pay

## 2013-09-17 ENCOUNTER — Other Ambulatory Visit: Payer: Self-pay | Admitting: Obstetrics and Gynecology

## 2013-09-17 ENCOUNTER — Ambulatory Visit
Admission: RE | Admit: 2013-09-17 | Discharge: 2013-09-17 | Disposition: A | Discharge status: Home / Self Care | Payer: BC Managed Care – PPO | Source: Ambulatory Visit | Attending: Obstetrics and Gynecology | Admitting: Obstetrics and Gynecology

## 2013-09-17 DIAGNOSIS — N644 Mastodynia: Secondary | ICD-10-CM

## 2013-09-19 ENCOUNTER — Other Ambulatory Visit: Payer: BC Managed Care – PPO

## 2013-10-24 ENCOUNTER — Other Ambulatory Visit: Payer: Self-pay

## 2013-10-25 ENCOUNTER — Other Ambulatory Visit: Payer: Self-pay

## 2013-10-25 ENCOUNTER — Encounter (HOSPITAL_COMMUNITY): Payer: Self-pay | Admitting: Obstetrics and Gynecology

## 2013-11-01 ENCOUNTER — Encounter (HOSPITAL_COMMUNITY): Payer: BC Managed Care – PPO

## 2013-11-05 ENCOUNTER — Other Ambulatory Visit (HOSPITAL_COMMUNITY): Payer: Self-pay | Admitting: Obstetrics and Gynecology

## 2013-11-05 ENCOUNTER — Ambulatory Visit (HOSPITAL_COMMUNITY)
Admission: RE | Admit: 2013-11-05 | Discharge: 2013-11-05 | Disposition: A | Discharge status: Home / Self Care | Payer: BC Managed Care – PPO | Source: Ambulatory Visit | Attending: Obstetrics and Gynecology | Admitting: Obstetrics and Gynecology

## 2013-11-05 ENCOUNTER — Encounter (HOSPITAL_COMMUNITY): Payer: Self-pay

## 2013-11-05 DIAGNOSIS — O34219 Maternal care for unspecified type scar from previous cesarean delivery: Secondary | ICD-10-CM | POA: Insufficient documentation

## 2013-11-05 DIAGNOSIS — O3680X0 Pregnancy with inconclusive fetal viability, not applicable or unspecified: Secondary | ICD-10-CM

## 2013-11-05 DIAGNOSIS — E079 Disorder of thyroid, unspecified: Secondary | ICD-10-CM | POA: Insufficient documentation

## 2013-11-05 DIAGNOSIS — O99280 Endocrine, nutritional and metabolic diseases complicating pregnancy, unspecified trimester: Secondary | ICD-10-CM

## 2013-11-05 DIAGNOSIS — O09299 Supervision of pregnancy with other poor reproductive or obstetric history, unspecified trimester: Secondary | ICD-10-CM | POA: Insufficient documentation

## 2013-11-05 DIAGNOSIS — Z8751 Personal history of pre-term labor: Secondary | ICD-10-CM | POA: Insufficient documentation

## 2013-11-05 DIAGNOSIS — O24919 Unspecified diabetes mellitus in pregnancy, unspecified trimester: Secondary | ICD-10-CM | POA: Insufficient documentation

## 2013-11-05 DIAGNOSIS — E039 Hypothyroidism, unspecified: Secondary | ICD-10-CM | POA: Insufficient documentation

## 2013-11-05 NOTE — Consult Note (Addendum)
MFM Staff Consultation:   I spoke with Marie Cook  regarding pregnancy complicated by IDDM and poorly controlled hypothyroidism.   1. IDDM:  I reviewed the classification scheme for Diabetes in Pregnancy, the maternal and fetal/neonatal risks, and the management of pregnancy with IDDM.  First, we discussed the maternal risks of pregnancy with underlying diabetes. With regard to the impact of pregnancy on maternal disease, I told her that patients with underlying vasculopathy, especially of the retina and kidneys, appear to fare much worse during pregnancy than women without underlying vascular disease. Background retinopathy does not seem to be a significant risk factor, although proliferative retinopathy and nephropathy clearly place patients at increased risk, both for poor perinatal outcome and for progression of eye or kidney disease. I told her that obstetric complications such as pre-eclampsia, sometimes severe and early-onset, are seen with increased frequency in women with insulin-dependent diabetes.  With regard to fetal risk, I detailed the first trimester risks, including miscarriage and fetal malformation. I told your patient that the miscarriage rate among diabetics appears to be around double that of the non-diabetic population, and that miscarriage risk is most closely related to the degree of glycemic control at conception. Similarly, the fetal malformation rate is increased in women with diabetes. With tight preconceptional control, however, the rate of structural malformation appears to be about the same as for the non-diabetic population, approximately two percent. With moderate preconceptional control, the rate appears to be around five percent. With poor control, the malformation rate is around ten percent or more. A hemoglobin A1c value from the first trimester is a reasonably good indicator of early glycemic control; HgbA1c values below about 6% are generally associated with a  low risk of fetal malformations. We discussed the second- and third-trimester risk of abnormal fetal growth, including both growth restriction related to placental insufficiency and macrosomia due to maternal-fetal hyperglycemia. We also recognized oligohydramnios, stillbirth, fetal distress, meconium, etc., as potential complications. I also went over the newborn metabolic derangements that can occur with maternal diabetes, including delayed pulmonary maturity and hypoglycemia. We talked about the tightness of glycemic control that is recommended before and during pregnancy. I reviewed the usual goals of achieving a HgbA1c value 6% or below. I detailed the blood sugar targets of having fasting values in the 75-95 mg/dl range, and postprandial values under 120 mg/dl at two hours. I emphasized the time and effort required to achieve tight control in pregnancy, including the meticulous fingerstick monitoring; close adherence to diet, insulin, and an exercise program; and, compliance with obstetric visits and scheduled fetal testing. We discussed that she is seeing her Endocrinologist for pump management, which our unit will defer to their expertise, noting we recommend review weekly or at least every two weeks in gestation by the Endocrinologist, Dr. Talmage Nap.  I gave the following recommendations to your patient. She should begin to work toward tighter control, aiming for the glycemic targets mentioned above. We will work with her on her glycemic control, speaking with her at least weekly and adjusting her insulin dose as needed. Also, she should have a retinal exam and a 24-hour urine collection, if they have not recently been done.  Furthermore, I asked her to call Dr. Talmage Nap today to review her glycemic measures to facilitate pump setting adjustments.  Our practice will defer management of the pump settings and weekly glycemic review (note:  Acceptable to review by phone, email, or in person) to Dr. Talmage Nap as it  appears that  she has excellent counseling and is under good care.  2. Hypothyroidism:  TSH 96.14 (initial in 10/21/2013) --> TSH 51.34 10/25/13 ---> today's result is pending  I explained there is a 1% incidence of hypothyroidism in the general population and 08/1998 incidence in pregnant women.  The thyroid is important for normal growth and maintenance of lipid and carbohydrate metabolism.  We discussed the pathophysiology of thyroid regulation including stimulation of the thyroid from TSH from the anterior pituitary.  In pregnancy, the physiology changes somewhat due to increased amounts of hormones such as estrogen which blocks the degradation of thyroid binding globulin and human chorionic gonadotropin which stimulates thyroid hormone secretion and suppresses TSH.  Throughout pregnancy, there is a 30-50% increase in the requirement of thyroxine.  In the fetus, the small amount of thyroxine that crosses the placenta provides thyroid hormone until 10-12 weeks.  After this time, the fetus begins to synthesis thyroid hormone.    We spent a large proportion of the visit discussing pregnancy outcomes in setting of uncontrolled hypothyroidism.  Some of the complications in pregnancy associated with untreated or partially treated hypothyroidism are preeclampsia, abruption, preterm birth and low birth weight. When the maternal free T4 is very low in pregnancy and the TSH is very high, the infant is at risk for impaired psychomotor function and a significantly lower IQ.  Her fetus fits in the very high risk category for poor outcome but I explained that this is not absolute.  She understands that termination of pregnancy in setting of poorly controlled hypothyroidism if these risks are unacceptable to her but she declines, wanting close follow up with endocrinology and MFM for obstetrical surveillance.  She also understands the limitation of ultrasound to identification of fetal structural defects and biometry  (cephalometry and overall growth) but an "IQ test" is not possible.  Rather, the risks as discussed are applicable with medical therapy being instituted to minimize such risks as much as medically possible.  I explained that we would defer adjustments to endocrinology since she is already under very close observation by Drs. Balan and Grewal.  We will continue to review her case along with her doctors noting that she is responding well.    Therefore, recommendations are to maintain serum TSH 0.5-2.0 microIU/ml and Free T4 in the upper third of normal range.  Measure TSH and Free T4 after adjusting doses no less than every 3-4 weeks in her case or more often if felt appropriate by Endocrinology.    She had a first trimester viability ultrasound today, and a separate report of this encounter will be made available.  Given her anxiety level regarding  At around 15-20 weeks, she should have msAFP screening offered, emphasizing the value of the singular MSAFP component as a screen for neural tube defects. At 16-18 weeks, she should have a targeted ultrasound exam to most fully evaluate fetal anatomy along with a formal fetal echocardiogram should be obtained. Your patient scheduled the next two viability evaluations today prior to her departure from our unit.  Beginning at 24 weeks, monthly ultrasounds for fetal growth are recommended. She will also need twice weekly non-stress tests beginning at 30-[redacted] weeks along with weekly AFI. I recommend HgbA1c surveillance every 4-6 weeks of gestation. Finally, assuming all goes well (ie, good glycemic control, AGA growth and reassuring testing), she should expect to be delivered by or shortly after 39 weeks, if spontaneous labor does not occur beforehand.  Impression: 1. SIUP at 8wks4d gestation 2. IDDM  3. Noncompliance with hypothyroidism now in compliance desiring to do whatever is necessary 4. Declines termination of pregnancy; desired pregnancy with full  understanding of risks.  Summary of Recommendations: 1. Weekly viability ultrasounds, close follow up with Endocrinology weekly for the next few weeks. 2. First trimester screen in 3 weeks followed by Second trimester msAFP at 15weeks with attempt for detailed fetal survey at 16 weeks given maternal concern.  She declines termination at this point in time. 3. Fetal echocardiogram at 18 weeks  4.  Monthly interval growth beginning at 24 weeks 5. Twice weekly NST and weekly AFI beginning at 32 weeks (at 30 weeks if HgbA1c is 7% or higher) 6. Close follow up with Endocrinology every two weeks minimum in pregnancy to review pump settings noting some of these reviews may be by phone glycemic log review 7. Would consider 24 hour urine collection and baseline preeclampsia labs before 20 weeks as a baseline reference 8. HgbA1c q4-6 weeks 9. TSH/free T4/Thyroxine index profile as per Endocrinology with adjustment of medication as per Endocrinology 10. q trimester urine culture to screen for asymptomatic bacteruria 12. Delivery at 39 weeks with excellent glyecemic control and reassuring maternal-fetal status; sooner if clinically indicated    Time Spent: I spent in excess of 80 minutes in consultation with this patient to review records, evaluate her case, and provide her with an adequate discussion and education. More than 50% of this time was spent in direct face-to-face counseling. It was a pleasure seeing your patient in the office today. Thank you for consultation. I should note that the patient asked me if I felt she had a good care plan for her endocrinologic conditions.  I reassured her that while there is real risk for miscarriage/pregnancy loss, fetal anomaly, growth restriction, preterm birth, adverse pregnancy outcome, and developmental delay/intellectual disability later in life, the medication regimen appears to be demonstrating the appropriate trend and that we appreciate the expertise by  Endocrinology.  We will continue to follow along.  She felt adequately reassured and did not feel the need to seek a second opinion at this point in time.  Please do not hesitate to contact our service for any further questions.   Rogelia Bogaenney, Jeffrey Morgan, MD, MS, FACOG Assistant Professor Section of Maternal-Fetal Medicine [facility]

## 2013-11-08 ENCOUNTER — Inpatient Hospital Stay (HOSPITAL_COMMUNITY): Admission: RE | Admit: 2013-11-08 | Payer: BC Managed Care – PPO | Source: Ambulatory Visit

## 2013-11-13 ENCOUNTER — Other Ambulatory Visit (HOSPITAL_COMMUNITY): Payer: Self-pay | Admitting: Obstetrics and Gynecology

## 2013-11-13 DIAGNOSIS — O3680X0 Pregnancy with inconclusive fetal viability, not applicable or unspecified: Secondary | ICD-10-CM

## 2013-11-15 ENCOUNTER — Ambulatory Visit (HOSPITAL_COMMUNITY)
Admission: RE | Admit: 2013-11-15 | Discharge: 2013-11-15 | Disposition: A | Discharge status: Home / Self Care | Payer: BC Managed Care – PPO | Source: Ambulatory Visit | Attending: Obstetrics and Gynecology | Admitting: Obstetrics and Gynecology

## 2013-11-15 ENCOUNTER — Ambulatory Visit (HOSPITAL_COMMUNITY): Admission: RE | Admit: 2013-11-15 | Payer: BC Managed Care – PPO | Source: Ambulatory Visit

## 2013-11-15 ENCOUNTER — Ambulatory Visit (HOSPITAL_COMMUNITY): Payer: BC Managed Care – PPO

## 2013-11-15 DIAGNOSIS — O09299 Supervision of pregnancy with other poor reproductive or obstetric history, unspecified trimester: Secondary | ICD-10-CM | POA: Insufficient documentation

## 2013-11-15 DIAGNOSIS — O24919 Unspecified diabetes mellitus in pregnancy, unspecified trimester: Secondary | ICD-10-CM | POA: Insufficient documentation

## 2013-11-15 DIAGNOSIS — E039 Hypothyroidism, unspecified: Secondary | ICD-10-CM | POA: Insufficient documentation

## 2013-11-15 DIAGNOSIS — O34219 Maternal care for unspecified type scar from previous cesarean delivery: Secondary | ICD-10-CM | POA: Insufficient documentation

## 2013-11-15 DIAGNOSIS — O99280 Endocrine, nutritional and metabolic diseases complicating pregnancy, unspecified trimester: Secondary | ICD-10-CM

## 2013-11-15 DIAGNOSIS — E079 Disorder of thyroid, unspecified: Secondary | ICD-10-CM | POA: Insufficient documentation

## 2013-11-15 DIAGNOSIS — O3680X0 Pregnancy with inconclusive fetal viability, not applicable or unspecified: Secondary | ICD-10-CM

## 2013-11-15 DIAGNOSIS — Z8751 Personal history of pre-term labor: Secondary | ICD-10-CM | POA: Insufficient documentation

## 2013-11-18 NOTE — Consult Note (Signed)
MFM Staff Consultation:   Delayed entry for Consult, 11/15/13:  Following ultrasound evaluation of fetal anatomy, [name] and her husband desired to readdress and have further discussion regarding management and options in setting of Type I IDDM and hypothyroidism.  She discussed her anxiety about the effects of noncompliance (not taking Synthroid as instructed in the months leading up to pregnancy and the first few weeks of pregnancy).    I emphasized the time and effort required to achieve glycemic tight control in pregnancy, including the meticulous fingerstick monitoring; close adherence to diet, insulin, and an exercise program; and, compliance with obstetric visits and scheduled fetal testing. We discussed that she is seeing her Endocrinologist for pump management, which our unit will defer to their expertise, noting we recommend review weekly or at least every two weeks in gestation by the Endocrinologist, Dr. Talmage NapBalan.  I explained to the patient that I spoke with Dr. Talmage NapBalan who is both comfortable with managing her DM and hypothyroidism and as it appears that she both has excellent counseling and is under good care.  We again discussed that in the fetus, the small amount of thyroxine that crosses the placenta provides thyroid hormone until 10-12 weeks.  After this time, the fetus begins to synthesis thyroid hormone.  Hence, the noncompliance with thyroid supplementation places her fetus at risk but does not confer absolute certainty in eventual outcome.  We again discussed that when the maternal free T4 is very low in pregnancy and the TSH is very high, the infant is at risk for impaired psychomotor function and a significantly lower IQ.  Her fetus fits in the very high risk category for poor outcome but I explained that this is not absolute.  She understands that termination of pregnancy in setting of poorly controlled hypothyroidism if these risks are unacceptable to her but she declines, wanting  close follow up with endocrinology and MFM for obstetrical surveillance.    She also understands the limitation of ultrasound to identification of fetal structural defects and biometry (cephalometry and overall growth) but an "IQ test" is not possible.  Rather, the risks as discussed are applicable with medical therapy being instituted to minimize such risks as much as medically possible.   Time Spent:  I spent in excess of 60 minutes in direct face-to-face consultation with this patient to review records, evaluate her case, and provide her with an adequate discussion and education. It was a pleasure seeing your patient in the office today. Thank you for consultation. I should note that the patient asked me if I felt she had a good care plan for her endocrinologic conditions.  I again reassured her that while there is real risk for miscarriage/pregnancy loss, fetal anomaly, growth restriction, preterm birth, adverse pregnancy outcome, and developmental delay/intellectual disability later in life, the medication regimen appears to be demonstrating the appropriate trend and that we appreciate the expertise by Endocrinology.  We will continue to follow along.  I also explained to her that if she remains under good control, her baby does have a good chance for normal intellectual development and that this is the most likely outcome albeit without guarantee.  She felt adequately reassured and did not feel the need to seek a second opinion at this point in time although one has been made in case she changes her mind at her request.  I feel that continued follow up with Dr. Talmage NapBalan is most ideal to avoid delays in adjustments of her medication regimen but have  complied with the patient request to have one arranged just in case she becomes unhappy with her care (as requested by the patient). I feel that she is under excellent care in your office.  Please do not hesitate to contact our service for any further  questions.   Rogelia Boga, MD, MS, FACOG Assistant Professor Section of Maternal-Fetal Medicine [facility]

## 2013-11-19 ENCOUNTER — Other Ambulatory Visit (HOSPITAL_COMMUNITY): Payer: Self-pay | Admitting: Obstetrics and Gynecology

## 2013-11-19 ENCOUNTER — Other Ambulatory Visit: Payer: Self-pay | Admitting: Obstetrics and Gynecology

## 2013-11-19 DIAGNOSIS — E079 Disorder of thyroid, unspecified: Secondary | ICD-10-CM

## 2013-11-19 DIAGNOSIS — O24919 Unspecified diabetes mellitus in pregnancy, unspecified trimester: Secondary | ICD-10-CM

## 2013-11-19 DIAGNOSIS — Z8751 Personal history of pre-term labor: Secondary | ICD-10-CM

## 2013-11-19 DIAGNOSIS — O99280 Endocrine, nutritional and metabolic diseases complicating pregnancy, unspecified trimester: Secondary | ICD-10-CM

## 2013-11-19 DIAGNOSIS — O3421 Maternal care for scar from previous cesarean delivery: Secondary | ICD-10-CM

## 2013-11-19 DIAGNOSIS — O09299 Supervision of pregnancy with other poor reproductive or obstetric history, unspecified trimester: Secondary | ICD-10-CM

## 2013-11-22 ENCOUNTER — Ambulatory Visit (HOSPITAL_COMMUNITY): Admission: RE | Admit: 2013-11-22 | Payer: BC Managed Care – PPO | Source: Ambulatory Visit

## 2013-11-22 ENCOUNTER — Other Ambulatory Visit (HOSPITAL_COMMUNITY): Payer: Self-pay | Admitting: Obstetrics and Gynecology

## 2013-11-22 ENCOUNTER — Ambulatory Visit (HOSPITAL_COMMUNITY)
Admission: RE | Admit: 2013-11-22 | Discharge: 2013-11-22 | Disposition: A | Discharge status: Home / Self Care | Payer: BC Managed Care – PPO | Source: Ambulatory Visit | Attending: Obstetrics and Gynecology | Admitting: Obstetrics and Gynecology

## 2013-11-22 DIAGNOSIS — O09299 Supervision of pregnancy with other poor reproductive or obstetric history, unspecified trimester: Secondary | ICD-10-CM

## 2013-11-22 DIAGNOSIS — O3421 Maternal care for scar from previous cesarean delivery: Secondary | ICD-10-CM

## 2013-11-22 DIAGNOSIS — Z8751 Personal history of pre-term labor: Secondary | ICD-10-CM | POA: Insufficient documentation

## 2013-11-22 DIAGNOSIS — O99280 Endocrine, nutritional and metabolic diseases complicating pregnancy, unspecified trimester: Secondary | ICD-10-CM

## 2013-11-22 DIAGNOSIS — O24919 Unspecified diabetes mellitus in pregnancy, unspecified trimester: Secondary | ICD-10-CM

## 2013-11-22 DIAGNOSIS — E079 Disorder of thyroid, unspecified: Secondary | ICD-10-CM | POA: Insufficient documentation

## 2013-12-13 ENCOUNTER — Ambulatory Visit (HOSPITAL_COMMUNITY)
Admission: RE | Admit: 2013-12-13 | Discharge: 2013-12-13 | Disposition: A | Discharge status: Home / Self Care | Payer: BC Managed Care – PPO | Source: Ambulatory Visit | Attending: Obstetrics and Gynecology | Admitting: Obstetrics and Gynecology

## 2013-12-13 DIAGNOSIS — O24919 Unspecified diabetes mellitus in pregnancy, unspecified trimester: Secondary | ICD-10-CM

## 2013-12-13 DIAGNOSIS — E079 Disorder of thyroid, unspecified: Secondary | ICD-10-CM

## 2013-12-13 DIAGNOSIS — O3421 Maternal care for scar from previous cesarean delivery: Secondary | ICD-10-CM

## 2013-12-13 DIAGNOSIS — O99280 Endocrine, nutritional and metabolic diseases complicating pregnancy, unspecified trimester: Secondary | ICD-10-CM

## 2013-12-13 DIAGNOSIS — O09299 Supervision of pregnancy with other poor reproductive or obstetric history, unspecified trimester: Secondary | ICD-10-CM

## 2013-12-13 DIAGNOSIS — E039 Hypothyroidism, unspecified: Secondary | ICD-10-CM | POA: Insufficient documentation

## 2013-12-13 DIAGNOSIS — E109 Type 1 diabetes mellitus without complications: Secondary | ICD-10-CM | POA: Insufficient documentation

## 2013-12-13 DIAGNOSIS — Z8751 Personal history of pre-term labor: Secondary | ICD-10-CM

## 2013-12-13 NOTE — Progress Notes (Signed)
[facility] ultrasound  Indication: 28 yr old 282P0101 at 2049w0d with type I diabetes and hypothyroidism that was previously poorly controlled for fetal ultrasound.  Findings: 1. Single intrauterine pregnancy. 2. Posterior placenta. 3. Normal amniotic fluid volume.  Recommendations: 1. Viable intrauterine pregnancy. 2. Type I diabetes: - previously counseled - followed by Endocrinology - fetal anatomic survey at 18-20 weeks - recommend fetal echocardiogram - recommend fetal growth every 4 weeks - recommend antenatal testing at 32 weeks - recommend strict glucose controle 3. Hypothyroidism: - previously counseled - now on synthroid and well controlled - previously counseled on risks of poor early control - recommend fetal surveillance as above  Eulis FosterKristen Arvell Pulsifer, MD

## 2014-01-10 ENCOUNTER — Other Ambulatory Visit (HOSPITAL_COMMUNITY): Payer: BC Managed Care – PPO

## 2014-01-17 ENCOUNTER — Other Ambulatory Visit (HOSPITAL_COMMUNITY): Payer: Self-pay | Admitting: Obstetrics and Gynecology

## 2014-01-17 ENCOUNTER — Encounter (HOSPITAL_COMMUNITY): Payer: Self-pay

## 2014-01-17 ENCOUNTER — Ambulatory Visit (HOSPITAL_COMMUNITY)
Admission: RE | Admit: 2014-01-17 | Discharge: 2014-01-17 | Disposition: A | Discharge status: Home / Self Care | Payer: BC Managed Care – PPO | Source: Ambulatory Visit | Attending: Obstetrics and Gynecology | Admitting: Obstetrics and Gynecology

## 2014-01-17 VITALS — BP 118/75 | HR 89 | Wt 194.0 lb

## 2014-01-17 DIAGNOSIS — Z8751 Personal history of pre-term labor: Secondary | ICD-10-CM

## 2014-01-17 DIAGNOSIS — E079 Disorder of thyroid, unspecified: Secondary | ICD-10-CM

## 2014-01-17 DIAGNOSIS — O3421 Maternal care for scar from previous cesarean delivery: Secondary | ICD-10-CM

## 2014-01-17 DIAGNOSIS — O34219 Maternal care for unspecified type scar from previous cesarean delivery: Secondary | ICD-10-CM | POA: Insufficient documentation

## 2014-01-17 DIAGNOSIS — E039 Hypothyroidism, unspecified: Secondary | ICD-10-CM

## 2014-01-17 DIAGNOSIS — O24919 Unspecified diabetes mellitus in pregnancy, unspecified trimester: Secondary | ICD-10-CM

## 2014-01-17 DIAGNOSIS — E109 Type 1 diabetes mellitus without complications: Secondary | ICD-10-CM

## 2014-01-17 DIAGNOSIS — O09299 Supervision of pregnancy with other poor reproductive or obstetric history, unspecified trimester: Secondary | ICD-10-CM

## 2014-01-17 DIAGNOSIS — O99280 Endocrine, nutritional and metabolic diseases complicating pregnancy, unspecified trimester: Secondary | ICD-10-CM

## 2014-02-12 ENCOUNTER — Ambulatory Visit (HOSPITAL_COMMUNITY)
Admission: RE | Admit: 2014-02-12 | Discharge: 2014-02-12 | Disposition: A | Discharge status: Home / Self Care | Payer: BC Managed Care – PPO | Source: Ambulatory Visit | Attending: Obstetrics and Gynecology | Admitting: Obstetrics and Gynecology

## 2014-02-12 DIAGNOSIS — E109 Type 1 diabetes mellitus without complications: Secondary | ICD-10-CM

## 2014-02-12 DIAGNOSIS — Z3689 Encounter for other specified antenatal screening: Secondary | ICD-10-CM | POA: Insufficient documentation

## 2014-02-12 DIAGNOSIS — E079 Disorder of thyroid, unspecified: Secondary | ICD-10-CM | POA: Insufficient documentation

## 2014-02-12 DIAGNOSIS — O99280 Endocrine, nutritional and metabolic diseases complicating pregnancy, unspecified trimester: Principal | ICD-10-CM

## 2014-02-12 DIAGNOSIS — O24919 Unspecified diabetes mellitus in pregnancy, unspecified trimester: Secondary | ICD-10-CM | POA: Insufficient documentation

## 2014-02-12 DIAGNOSIS — E039 Hypothyroidism, unspecified: Secondary | ICD-10-CM | POA: Insufficient documentation

## 2014-02-13 ENCOUNTER — Other Ambulatory Visit (HOSPITAL_COMMUNITY): Payer: Self-pay | Admitting: Maternal and Fetal Medicine

## 2014-02-13 DIAGNOSIS — O24319 Unspecified pre-existing diabetes mellitus in pregnancy, unspecified trimester: Secondary | ICD-10-CM

## 2014-03-13 ENCOUNTER — Ambulatory Visit (HOSPITAL_COMMUNITY): Payer: BC Managed Care – PPO

## 2014-03-21 ENCOUNTER — Ambulatory Visit (HOSPITAL_COMMUNITY)
Admission: RE | Admit: 2014-03-21 | Discharge: 2014-03-21 | Disposition: A | Discharge status: Home / Self Care | Payer: BC Managed Care – PPO | Source: Ambulatory Visit | Attending: Maternal and Fetal Medicine | Admitting: Maternal and Fetal Medicine

## 2014-03-21 ENCOUNTER — Encounter (HOSPITAL_COMMUNITY): Payer: Self-pay

## 2014-03-21 DIAGNOSIS — O24919 Unspecified diabetes mellitus in pregnancy, unspecified trimester: Secondary | ICD-10-CM | POA: Insufficient documentation

## 2014-03-21 DIAGNOSIS — E119 Type 2 diabetes mellitus without complications: Secondary | ICD-10-CM | POA: Insufficient documentation

## 2014-03-21 DIAGNOSIS — O24319 Unspecified pre-existing diabetes mellitus in pregnancy, unspecified trimester: Secondary | ICD-10-CM

## 2014-03-31 ENCOUNTER — Other Ambulatory Visit (HOSPITAL_COMMUNITY): Payer: Self-pay | Admitting: Obstetrics and Gynecology

## 2014-03-31 DIAGNOSIS — O99280 Endocrine, nutritional and metabolic diseases complicating pregnancy, unspecified trimester: Secondary | ICD-10-CM

## 2014-03-31 DIAGNOSIS — O3421 Maternal care for scar from previous cesarean delivery: Secondary | ICD-10-CM

## 2014-03-31 DIAGNOSIS — O24919 Unspecified diabetes mellitus in pregnancy, unspecified trimester: Secondary | ICD-10-CM

## 2014-03-31 DIAGNOSIS — Z8751 Personal history of pre-term labor: Secondary | ICD-10-CM

## 2014-03-31 DIAGNOSIS — O09299 Supervision of pregnancy with other poor reproductive or obstetric history, unspecified trimester: Secondary | ICD-10-CM

## 2014-03-31 DIAGNOSIS — E079 Disorder of thyroid, unspecified: Secondary | ICD-10-CM

## 2014-04-16 ENCOUNTER — Encounter (HOSPITAL_COMMUNITY): Payer: Self-pay

## 2014-04-16 ENCOUNTER — Ambulatory Visit (HOSPITAL_COMMUNITY)
Admission: RE | Admit: 2014-04-16 | Discharge: 2014-04-16 | Disposition: A | Discharge status: Home / Self Care | Payer: BC Managed Care – PPO | Source: Ambulatory Visit | Attending: Obstetrics and Gynecology | Admitting: Obstetrics and Gynecology

## 2014-04-16 DIAGNOSIS — E079 Disorder of thyroid, unspecified: Secondary | ICD-10-CM | POA: Diagnosis not present

## 2014-04-16 DIAGNOSIS — O24919 Unspecified diabetes mellitus in pregnancy, unspecified trimester: Secondary | ICD-10-CM

## 2014-04-16 DIAGNOSIS — O3421 Maternal care for scar from previous cesarean delivery: Secondary | ICD-10-CM

## 2014-04-16 DIAGNOSIS — O34219 Maternal care for unspecified type scar from previous cesarean delivery: Secondary | ICD-10-CM | POA: Diagnosis not present

## 2014-04-16 DIAGNOSIS — O99280 Endocrine, nutritional and metabolic diseases complicating pregnancy, unspecified trimester: Secondary | ICD-10-CM

## 2014-04-16 DIAGNOSIS — Z8751 Personal history of pre-term labor: Secondary | ICD-10-CM | POA: Diagnosis not present

## 2014-04-16 DIAGNOSIS — O09299 Supervision of pregnancy with other poor reproductive or obstetric history, unspecified trimester: Secondary | ICD-10-CM

## 2014-04-18 ENCOUNTER — Ambulatory Visit (HOSPITAL_COMMUNITY): Payer: BC Managed Care – PPO

## 2014-04-25 ENCOUNTER — Other Ambulatory Visit (HOSPITAL_COMMUNITY): Payer: Self-pay | Admitting: Obstetrics and Gynecology

## 2014-04-25 DIAGNOSIS — R1011 Right upper quadrant pain: Secondary | ICD-10-CM

## 2014-04-29 ENCOUNTER — Ambulatory Visit (HOSPITAL_COMMUNITY): Payer: BC Managed Care – PPO

## 2014-05-01 ENCOUNTER — Other Ambulatory Visit (HOSPITAL_COMMUNITY): Payer: Self-pay | Admitting: Obstetrics and Gynecology

## 2014-05-01 ENCOUNTER — Ambulatory Visit (HOSPITAL_COMMUNITY)
Admission: RE | Admit: 2014-05-01 | Discharge: 2014-05-01 | Disposition: A | Discharge status: Home / Self Care | Payer: BC Managed Care – PPO | Source: Ambulatory Visit | Attending: Obstetrics and Gynecology | Admitting: Obstetrics and Gynecology

## 2014-05-01 DIAGNOSIS — R109 Unspecified abdominal pain: Secondary | ICD-10-CM | POA: Insufficient documentation

## 2014-05-01 DIAGNOSIS — R1011 Right upper quadrant pain: Secondary | ICD-10-CM

## 2014-05-08 ENCOUNTER — Other Ambulatory Visit (HOSPITAL_COMMUNITY): Payer: Self-pay | Admitting: Obstetrics and Gynecology

## 2014-05-08 DIAGNOSIS — O24919 Unspecified diabetes mellitus in pregnancy, unspecified trimester: Secondary | ICD-10-CM

## 2014-05-08 DIAGNOSIS — Z8751 Personal history of pre-term labor: Secondary | ICD-10-CM

## 2014-05-08 DIAGNOSIS — O09299 Supervision of pregnancy with other poor reproductive or obstetric history, unspecified trimester: Secondary | ICD-10-CM

## 2014-05-08 DIAGNOSIS — Z9889 Other specified postprocedural states: Secondary | ICD-10-CM

## 2014-05-08 DIAGNOSIS — O99284 Endocrine, nutritional and metabolic diseases complicating childbirth: Secondary | ICD-10-CM

## 2014-05-08 DIAGNOSIS — O3421 Maternal care for scar from previous cesarean delivery: Secondary | ICD-10-CM

## 2014-05-08 DIAGNOSIS — E079 Disorder of thyroid, unspecified: Secondary | ICD-10-CM

## 2014-05-08 DIAGNOSIS — E039 Hypothyroidism, unspecified: Secondary | ICD-10-CM

## 2014-05-15 ENCOUNTER — Ambulatory Visit (HOSPITAL_COMMUNITY)
Admission: RE | Admit: 2014-05-15 | Discharge: 2014-05-15 | Disposition: A | Discharge status: Home / Self Care | Payer: BC Managed Care – PPO | Source: Ambulatory Visit | Attending: Obstetrics and Gynecology | Admitting: Obstetrics and Gynecology

## 2014-05-15 ENCOUNTER — Encounter (HOSPITAL_COMMUNITY): Payer: Self-pay

## 2014-05-15 VITALS — BP 123/79 | HR 82 | Wt 219.0 lb

## 2014-05-15 DIAGNOSIS — E039 Hypothyroidism, unspecified: Secondary | ICD-10-CM

## 2014-05-15 DIAGNOSIS — Z9889 Other specified postprocedural states: Secondary | ICD-10-CM

## 2014-05-15 DIAGNOSIS — O99284 Endocrine, nutritional and metabolic diseases complicating childbirth: Secondary | ICD-10-CM | POA: Diagnosis not present

## 2014-05-15 DIAGNOSIS — O09299 Supervision of pregnancy with other poor reproductive or obstetric history, unspecified trimester: Secondary | ICD-10-CM

## 2014-05-15 DIAGNOSIS — O09293 Supervision of pregnancy with other poor reproductive or obstetric history, third trimester: Secondary | ICD-10-CM

## 2014-05-15 DIAGNOSIS — Z8751 Personal history of pre-term labor: Secondary | ICD-10-CM | POA: Insufficient documentation

## 2014-05-15 DIAGNOSIS — O24013 Pre-existing diabetes mellitus, type 1, in pregnancy, third trimester: Secondary | ICD-10-CM | POA: Diagnosis present

## 2014-05-15 DIAGNOSIS — Z3A35 35 weeks gestation of pregnancy: Secondary | ICD-10-CM | POA: Diagnosis not present

## 2014-05-15 DIAGNOSIS — O34219 Maternal care for unspecified type scar from previous cesarean delivery: Secondary | ICD-10-CM

## 2014-05-15 DIAGNOSIS — O24919 Unspecified diabetes mellitus in pregnancy, unspecified trimester: Secondary | ICD-10-CM

## 2014-05-15 DIAGNOSIS — O3421 Maternal care for scar from previous cesarean delivery: Secondary | ICD-10-CM | POA: Diagnosis not present

## 2014-05-15 DIAGNOSIS — E079 Disorder of thyroid, unspecified: Secondary | ICD-10-CM

## 2014-05-29 ENCOUNTER — Encounter (HOSPITAL_COMMUNITY): Payer: Self-pay

## 2014-05-29 ENCOUNTER — Encounter (HOSPITAL_COMMUNITY): Payer: Self-pay | Admitting: *Deleted

## 2014-05-30 ENCOUNTER — Inpatient Hospital Stay (HOSPITAL_COMMUNITY)
Admission: AD | Admit: 2014-05-30 | Discharge: 2014-06-01 | DRG: 765 | Disposition: A | Discharge status: Home / Self Care | Payer: BC Managed Care – PPO | Source: Ambulatory Visit | Attending: Obstetrics and Gynecology | Admitting: Obstetrics and Gynecology

## 2014-05-30 ENCOUNTER — Encounter (HOSPITAL_COMMUNITY): Payer: Self-pay | Admitting: Certified Registered Nurse Anesthetist

## 2014-05-30 ENCOUNTER — Encounter (HOSPITAL_COMMUNITY): Payer: BC Managed Care – PPO | Admitting: Anesthesiology

## 2014-05-30 ENCOUNTER — Inpatient Hospital Stay (HOSPITAL_COMMUNITY): Payer: BC Managed Care – PPO | Admitting: Anesthesiology

## 2014-05-30 ENCOUNTER — Encounter (HOSPITAL_COMMUNITY)
Admission: AD | Disposition: A | Discharge status: Home / Self Care | Payer: Self-pay | Source: Ambulatory Visit | Attending: Obstetrics and Gynecology

## 2014-05-30 DIAGNOSIS — O3663X0 Maternal care for excessive fetal growth, third trimester, not applicable or unspecified: Secondary | ICD-10-CM | POA: Diagnosis present

## 2014-05-30 DIAGNOSIS — O99284 Endocrine, nutritional and metabolic diseases complicating childbirth: Secondary | ICD-10-CM | POA: Diagnosis present

## 2014-05-30 DIAGNOSIS — Z3A38 38 weeks gestation of pregnancy: Secondary | ICD-10-CM | POA: Diagnosis present

## 2014-05-30 DIAGNOSIS — E039 Hypothyroidism, unspecified: Secondary | ICD-10-CM | POA: Diagnosis present

## 2014-05-30 DIAGNOSIS — E109 Type 1 diabetes mellitus without complications: Secondary | ICD-10-CM | POA: Diagnosis present

## 2014-05-30 DIAGNOSIS — O3421 Maternal care for scar from previous cesarean delivery: Principal | ICD-10-CM | POA: Diagnosis present

## 2014-05-30 DIAGNOSIS — Z794 Long term (current) use of insulin: Secondary | ICD-10-CM

## 2014-05-30 DIAGNOSIS — O133 Gestational [pregnancy-induced] hypertension without significant proteinuria, third trimester: Secondary | ICD-10-CM | POA: Diagnosis present

## 2014-05-30 DIAGNOSIS — Z98891 History of uterine scar from previous surgery: Secondary | ICD-10-CM

## 2014-05-30 DIAGNOSIS — O2402 Pre-existing diabetes mellitus, type 1, in childbirth: Secondary | ICD-10-CM | POA: Diagnosis present

## 2014-05-30 LAB — BASIC METABOLIC PANEL
ANION GAP: 13 (ref 5–15)
BUN: 18 mg/dL (ref 6–23)
CALCIUM: 8.8 mg/dL (ref 8.4–10.5)
CHLORIDE: 102 meq/L (ref 96–112)
CO2: 21 meq/L (ref 19–32)
CREATININE: 0.86 mg/dL (ref 0.50–1.10)
GFR calc non Af Amer: 90 mL/min — ABNORMAL LOW (ref >90)
Glucose, Bld: 103 mg/dL — ABNORMAL HIGH (ref 70–99)
Potassium: 3.6 mEq/L — ABNORMAL LOW (ref 3.7–5.3)
Sodium: 136 mEq/L — ABNORMAL LOW (ref 137–147)

## 2014-05-30 LAB — TYPE AND SCREEN
ABO/RH(D): A POS
ANTIBODY SCREEN: NEGATIVE

## 2014-05-30 LAB — CBC
HCT: 34.5 % — ABNORMAL LOW (ref 36.0–46.0)
Hemoglobin: 12.1 g/dL (ref 12.0–15.0)
MCH: 32.4 pg (ref 26.0–34.0)
MCHC: 35.1 g/dL (ref 30.0–36.0)
MCV: 92.2 fL (ref 78.0–100.0)
Platelets: 213 10*3/uL (ref 150–400)
RBC: 3.74 MIL/uL — AB (ref 3.87–5.11)
RDW: 13.7 % (ref 11.5–15.5)
WBC: 11 10*3/uL — AB (ref 4.0–10.5)

## 2014-05-30 LAB — GLUCOSE, CAPILLARY: Glucose-Capillary: 109 mg/dL — ABNORMAL HIGH (ref 70–99)

## 2014-05-30 LAB — ABO/RH: ABO/RH(D): A POS

## 2014-05-30 LAB — SYPHILIS: RPR W/REFLEX TO RPR TITER AND TREPONEMAL ANTIBODIES, TRADITIONAL SCREENING AND DIAGNOSIS ALGORITHM

## 2014-05-30 SURGERY — Surgical Case
Anesthesia: Spinal | Site: Abdomen

## 2014-05-30 MED ORDER — LACTATED RINGERS IV SOLN
INTRAVENOUS | Status: DC | PRN
  Administered 2014-05-30: 10:00:00 via INTRAVENOUS

## 2014-05-30 MED ORDER — CEFAZOLIN SODIUM-DEXTROSE 2-3 GM-% IV SOLR
INTRAVENOUS | Status: AC
  Filled 2014-05-30: qty 50

## 2014-05-30 MED ORDER — MEPERIDINE HCL 25 MG/ML IJ SOLN: 6.2500 mg | INTRAMUSCULAR | Status: DC | PRN

## 2014-05-30 MED ORDER — KETOROLAC TROMETHAMINE 30 MG/ML IJ SOLN: 15.0000 mg | Freq: Once | INTRAMUSCULAR | Status: DC | PRN

## 2014-05-30 MED ORDER — NALBUPHINE HCL 10 MG/ML IJ SOLN: 5.0000 mg | Freq: Once | INTRAMUSCULAR | Status: AC | PRN

## 2014-05-30 MED ORDER — MORPHINE SULFATE 0.5 MG/ML IJ SOLN
INTRAMUSCULAR | Status: AC
  Filled 2014-05-30: qty 10

## 2014-05-30 MED ORDER — OXYTOCIN 40 UNITS IN LACTATED RINGERS INFUSION - SIMPLE MED
INTRAVENOUS | Status: DC | PRN
  Administered 2014-05-30: 40 [IU] via INTRAVENOUS

## 2014-05-30 MED ORDER — PROMETHAZINE HCL 25 MG/ML IJ SOLN: 6.2500 mg | INTRAMUSCULAR | Status: DC | PRN

## 2014-05-30 MED ORDER — NALOXONE HCL 0.4 MG/ML IJ SOLN
0.4000 mg | INTRAMUSCULAR | Status: DC | PRN
Start: 2014-05-30 — End: 2014-06-01

## 2014-05-30 MED ORDER — SODIUM CHLORIDE 0.9 % IJ SOLN: 3.0000 mL | INTRAMUSCULAR | Status: DC | PRN

## 2014-05-30 MED ORDER — DIPHENHYDRAMINE HCL 25 MG PO CAPS: 25.0000 mg | ORAL_CAPSULE | ORAL | Status: DC | PRN

## 2014-05-30 MED ORDER — CEFAZOLIN SODIUM-DEXTROSE 2-3 GM-% IV SOLR: 2.0000 g | INTRAVENOUS | Status: DC

## 2014-05-30 MED ORDER — DIPHENHYDRAMINE HCL 50 MG/ML IJ SOLN: 12.5000 mg | INTRAMUSCULAR | Status: DC | PRN

## 2014-05-30 MED ORDER — ONDANSETRON HCL 4 MG/2ML IJ SOLN
INTRAMUSCULAR | Status: DC | PRN
  Administered 2014-05-30: 4 mg via INTRAVENOUS

## 2014-05-30 MED ORDER — LACTATED RINGERS IV SOLN
INTRAVENOUS | Status: DC | PRN
  Administered 2014-05-30 (×4): via INTRAVENOUS

## 2014-05-30 MED ORDER — SUCCINYLCHOLINE CHLORIDE 20 MG/ML IJ SOLN
INTRAMUSCULAR | Status: AC
  Filled 2014-05-30: qty 10

## 2014-05-30 MED ORDER — LACTATED RINGERS IV SOLN: INTRAVENOUS | Status: DC

## 2014-05-30 MED ORDER — ONDANSETRON HCL 4 MG/2ML IJ SOLN
INTRAMUSCULAR | Status: AC
  Filled 2014-05-30: qty 2

## 2014-05-30 MED ORDER — FENTANYL CITRATE 0.05 MG/ML IJ SOLN
INTRAMUSCULAR | Status: DC | PRN
  Administered 2014-05-30: 12.5 ug via INTRATHECAL
  Administered 2014-05-30 (×2): 50 ug via INTRAVENOUS

## 2014-05-30 MED ORDER — SCOPOLAMINE 1 MG/3DAYS TD PT72
1.0000 | MEDICATED_PATCH | Freq: Once | TRANSDERMAL | Status: DC
  Administered 2014-05-30: 1.5 mg via TRANSDERMAL

## 2014-05-30 MED ORDER — LEVOTHYROXINE SODIUM 112 MCG PO TABS
112.0000 ug | ORAL_TABLET | Freq: Every day | ORAL | Status: DC
  Administered 2014-06-01: 112 ug via ORAL
  Filled 2014-05-30 (×3): qty 1

## 2014-05-30 MED ORDER — MORPHINE SULFATE (PF) 0.5 MG/ML IJ SOLN
INTRAMUSCULAR | Status: DC | PRN
  Administered 2014-05-30: .2 mg via INTRATHECAL

## 2014-05-30 MED ORDER — KETOROLAC TROMETHAMINE 30 MG/ML IJ SOLN
30.0000 mg | Freq: Four times a day (QID) | INTRAMUSCULAR | Status: DC | PRN
  Administered 2014-05-30: 30 mg via INTRAMUSCULAR

## 2014-05-30 MED ORDER — NALOXONE HCL 1 MG/ML IJ SOLN
1.0000 ug/kg/h | INTRAVENOUS | Status: DC | PRN
  Filled 2014-05-30: qty 2

## 2014-05-30 MED ORDER — FENTANYL CITRATE 0.05 MG/ML IJ SOLN
INTRAMUSCULAR | Status: AC
  Filled 2014-05-30: qty 2

## 2014-05-30 MED ORDER — ONDANSETRON HCL 4 MG/2ML IJ SOLN: 4.0000 mg | Freq: Three times a day (TID) | INTRAMUSCULAR | Status: DC | PRN

## 2014-05-30 MED ORDER — 0.9 % SODIUM CHLORIDE (POUR BTL) OPTIME
TOPICAL | Status: DC | PRN
  Administered 2014-05-30: 1000 mL

## 2014-05-30 MED ORDER — NALBUPHINE HCL 10 MG/ML IJ SOLN: 5.0000 mg | INTRAMUSCULAR | Status: DC | PRN

## 2014-05-30 MED ORDER — KETOROLAC TROMETHAMINE 30 MG/ML IJ SOLN: 30.0000 mg | Freq: Four times a day (QID) | INTRAMUSCULAR | Status: DC | PRN

## 2014-05-30 MED ORDER — MIDAZOLAM HCL 5 MG/5ML IJ SOLN
INTRAMUSCULAR | Status: DC | PRN
  Administered 2014-05-30: 2 mg via INTRAVENOUS

## 2014-05-30 MED ORDER — LIDOCAINE HCL (CARDIAC) 20 MG/ML IV SOLN
INTRAVENOUS | Status: AC
  Filled 2014-05-30: qty 5

## 2014-05-30 MED ORDER — HYDROMORPHONE HCL 1 MG/ML IJ SOLN
0.2500 mg | INTRAMUSCULAR | Status: DC | PRN
  Administered 2014-05-30 (×2): 0.5 mg via INTRAVENOUS

## 2014-05-30 MED ORDER — BUPIVACAINE IN DEXTROSE 0.75-8.25 % IT SOLN
INTRATHECAL | Status: DC | PRN
  Administered 2014-05-30: 1.6 mL via INTRATHECAL

## 2014-05-30 MED ORDER — PHENYLEPHRINE HCL 10 MG/ML IJ SOLN
INTRAMUSCULAR | Status: AC
  Filled 2014-05-30: qty 1

## 2014-05-30 MED ORDER — CEFAZOLIN SODIUM-DEXTROSE 2-3 GM-% IV SOLR
INTRAVENOUS | Status: DC | PRN
  Administered 2014-05-30: 2 g via INTRAVENOUS

## 2014-05-30 MED ORDER — IBUPROFEN 600 MG PO TABS
600.0000 mg | ORAL_TABLET | Freq: Four times a day (QID) | ORAL | Status: DC | PRN
  Administered 2014-05-31 – 2014-06-01 (×4): 600 mg via ORAL
  Filled 2014-05-30 (×6): qty 1

## 2014-05-30 MED ORDER — MIDAZOLAM HCL 2 MG/2ML IJ SOLN
INTRAMUSCULAR | Status: AC
  Filled 2014-05-30: qty 2

## 2014-05-30 MED ORDER — INSULIN PUMP
Freq: Three times a day (TID) | SUBCUTANEOUS | Status: DC
  Filled 2014-05-30: qty 1

## 2014-05-30 MED ORDER — OXYTOCIN 10 UNIT/ML IJ SOLN
INTRAMUSCULAR | Status: AC
  Filled 2014-05-30: qty 4

## 2014-05-30 MED ORDER — PHENYLEPHRINE 8 MG IN D5W 100 ML (0.08MG/ML) PREMIX OPTIME
INJECTION | INTRAVENOUS | Status: DC | PRN
  Administered 2014-05-30: 60 ug/min via INTRAVENOUS

## 2014-05-30 MED ORDER — INSULIN PUMP
Freq: Three times a day (TID) | SUBCUTANEOUS | Status: DC
  Administered 2014-05-30: 6.5 via SUBCUTANEOUS
  Administered 2014-05-31: 17.65 via SUBCUTANEOUS
  Administered 2014-05-31 (×2): via SUBCUTANEOUS
  Administered 2014-06-01: 20.3 via SUBCUTANEOUS
  Administered 2014-06-01: 08:00:00 via SUBCUTANEOUS
  Filled 2014-05-30: qty 1

## 2014-05-30 MED ORDER — HYDROMORPHONE HCL 1 MG/ML IJ SOLN
INTRAMUSCULAR | Status: AC
  Filled 2014-05-30: qty 1

## 2014-05-30 MED ORDER — KETOROLAC TROMETHAMINE 30 MG/ML IJ SOLN
INTRAMUSCULAR | Status: AC
  Administered 2014-05-30: 30 mg via INTRAMUSCULAR
  Filled 2014-05-30: qty 1

## 2014-05-30 MED ORDER — LACTATED RINGERS IV SOLN
Freq: Once | INTRAVENOUS | Status: AC
  Administered 2014-05-30: 09:00:00 via INTRAVENOUS

## 2014-05-30 MED ORDER — SCOPOLAMINE 1 MG/3DAYS TD PT72
MEDICATED_PATCH | TRANSDERMAL | Status: AC
  Administered 2014-05-30: 1.5 mg via TRANSDERMAL
  Filled 2014-05-30: qty 1

## 2014-05-30 MED ORDER — BUPIVACAINE HCL (PF) 0.25 % IJ SOLN
INTRAMUSCULAR | Status: AC
  Filled 2014-05-30: qty 30

## 2014-05-30 SURGICAL SUPPLY — 33 items
ADH SKN CLS APL DERMABOND .7 (GAUZE/BANDAGES/DRESSINGS) ×1
BARRIER ADHS 3X4 INTERCEED (GAUZE/BANDAGES/DRESSINGS) IMPLANT
BRR ADH 4X3 ABS CNTRL BYND (GAUZE/BANDAGES/DRESSINGS)
CLAMP CORD UMBIL (MISCELLANEOUS) IMPLANT
CLOTH BEACON ORANGE TIMEOUT ST (SAFETY) ×2 IMPLANT
CONTAINER PREFILL 10% NBF 15ML (MISCELLANEOUS) IMPLANT
DERMABOND ADVANCED (GAUZE/BANDAGES/DRESSINGS) ×1
DERMABOND ADVANCED .7 DNX12 (GAUZE/BANDAGES/DRESSINGS) ×1 IMPLANT
DRAPE SHEET LG 3/4 BI-LAMINATE (DRAPES) IMPLANT
DRSG OPSITE POSTOP 4X10 (GAUZE/BANDAGES/DRESSINGS) ×2 IMPLANT
DURAPREP 26ML APPLICATOR (WOUND CARE) ×2 IMPLANT
ELECT REM PT RETURN 9FT ADLT (ELECTROSURGICAL) ×2
ELECTRODE REM PT RTRN 9FT ADLT (ELECTROSURGICAL) ×1 IMPLANT
EXTRACTOR VACUUM M CUP 4 TUBE (SUCTIONS) IMPLANT
GLOVE BIO SURGEON STRL SZ 6.5 (GLOVE) ×2 IMPLANT
GOWN STRL REUS W/TWL LRG LVL3 (GOWN DISPOSABLE) ×4 IMPLANT
KIT ABG SYR 3ML LUER SLIP (SYRINGE) IMPLANT
NEEDLE HYPO 22GX1.5 SAFETY (NEEDLE) IMPLANT
NEEDLE HYPO 25X5/8 SAFETYGLIDE (NEEDLE) ×2 IMPLANT
NS IRRIG 1000ML POUR BTL (IV SOLUTION) ×2 IMPLANT
PACK C SECTION WH (CUSTOM PROCEDURE TRAY) ×2 IMPLANT
PAD OB MATERNITY 4.3X12.25 (PERSONAL CARE ITEMS) ×2 IMPLANT
STAPLER VISISTAT 35W (STAPLE) IMPLANT
SUT CHROMIC 0 CTX 36 (SUTURE) ×6 IMPLANT
SUT PLAIN 0 NONE (SUTURE) IMPLANT
SUT PLAIN 2 0 XLH (SUTURE) IMPLANT
SUT VIC AB 0 CT1 27 (SUTURE) ×4
SUT VIC AB 0 CT1 27XBRD ANBCTR (SUTURE) ×4 IMPLANT
SUT VIC AB 4-0 KS 27 (SUTURE) ×2 IMPLANT
SYR CONTROL 10ML LL (SYRINGE) IMPLANT
TOWEL OR 17X24 6PK STRL BLUE (TOWEL DISPOSABLE) ×2 IMPLANT
TRAY FOLEY CATH 14FR (SET/KITS/TRAYS/PACK) ×2 IMPLANT
WATER STERILE IRR 1000ML POUR (IV SOLUTION) ×2 IMPLANT

## 2014-05-30 NOTE — Progress Notes (Signed)
H and P on the chart EFW 9 pound 4 oz on ultrasound yesterday Will proceed with Repeat LTCS Consent signed

## 2014-05-30 NOTE — Addendum Note (Signed)
Addendum created 05/30/14 1714 by Turner DanielsJennifer L Xylan Sheils, CRNA   Modules edited: Notes Section   Notes Section:  File: [id number]

## 2014-05-30 NOTE — Anesthesia Postprocedure Evaluation (Signed)
  Anesthesia Post-op Note  Anesthesia Post Note  Patient: Marie Cook  Procedure(s) Performed: Procedure(s) (LRB): CESAREAN SECTION (N/A)  Anesthesia type: Spinal  Patient location: Mother/Baby  Post pain: Pain level controlled  Post assessment: Post-op Vital signs reviewed  Last Vitals:  Filed Vitals:   05/30/14 1250  BP: 134/77  Pulse: 65  Temp: 36.8 C  Resp: 20    Post vital signs: Reviewed  Level of consciousness: awake  Complications: No apparent anesthesia complications

## 2014-05-30 NOTE — Op Note (Signed)
[name]:  Marie Cook, Marie Cook               ACCOUNT NO.:  [id number]  MEDICAL RECORD NO.:  [id number]  LOCATION:  [facility]                          FACILITY:  [facility]  PHYSICIAN:  Ervan Heber L. Kyleena Scheirer, M.D.DATE OF BIRTH:  [birth year]  DATE OF PROCEDURE:  05/30/2014 DATE OF DISCHARGE:                              OPERATIVE REPORT   PREOPERATIVE DIAGNOSES: 1. Intrauterine pregnancy at 38 weeks. 2. Insulin-dependent diabetes, large for gestational age and pregnancy     induced hypertension, and previous C-section.  POSTOPERATIVE DIAGNOSES: 1. Intrauterine pregnancy at 38 weeks. 2. Insulin-dependent diabetes, large for gestational age and pregnancy     induced hypertension, and previous C-section.  PROCEDURE:  Repeat low transverse cesarean section.  SURGEON:  Lucylle Foulkes L. Marisela Line, MD  ANESTHESIA:  Spinal.  EBL:  900 mL.  COMPLICATIONS:  None.  DRAINS:  Foley catheter.  PROCEDURE DESCRIPTION:  Patient was taken to the operating room.  Her spinal was placed by Dr. Arby BarretteHatchett.  She was prepped and draped in usual sterile fashion.  A Foley catheter was inserted.  A low transverse incision was made, carried down to the fascia.  Fascia scored in the midline, extended laterally.  Rectus muscles were separated in the midline.  The peritoneum was entered bluntly.  The peritoneal incision was then stretched.  The bladder blade was inserted, lower uterine segment was identified.  The bladder flap was created sharply and then digitally.  The bladder blade was then readjusted.  A low transverse incision was made in the uterus.  Uterus was entered using hemostat. The baby was delivered easily with 1 gentle pull of the vacuum, was large for gestational age, was a female infant.  The cord was clamped and cut.  The baby was handed to the awaiting neonatal team.  The uterus after cord blood collection was obtained per patient request.  The placenta was manually removed noted to be normal, intact with a  three- vessel cord.  The uterus was exteriorized and cleared of all clots and debris.  It was closed in 2 layers using 0 chromic in a running, locked stitch.  Irrigation was performed.  Hemostasis was noted.  The peritoneum was closed using 0 Vicryl.  The fascia was closed using 0 Vicryl starting each corner meeting in the midline.  After irrigation of subcutaneous layer, the skin was closed with a 4-0 Vicryl on a Keith needle.  All sponge, lap, and instrument counts were correct x2.  The patient went to recovery room in stable condition.     Marie Cook, M.D.     Marie AversMLG/MEDQ  D:  05/30/2014  T:  05/30/2014  Job:  [id number]

## 2014-05-30 NOTE — Lactation Note (Signed)
This note was copied from the chart of Marie Sona Castrellon. Lactation Consultation Note: Initial visit with mom- baby now 4 hours old. Mom using hand pump when I went into room. Baby just had formula due to low blood sugar. Mom pumped about 5 cc's Colostrum. Asking about DEBP- Setup for mom. Pumping as I left room. She has pumped for baby in NICU 4 years ago. Encouraged to use 27 flanges. Mom with flat nipples and reports she used shells and NS in attempting to latch last baby. Shells given to mom and she states she will put them on when she finishes pumping. Encouraged to page for assist when baby is ready to feed again. No questions at present. BF brochure given with resources for support after DC.   Patient Name: Marie Cook Today's Date: 05/30/2014 Reason for consult: Initial assessment   Maternal Data Formula Feeding for Exclusion: No Has patient been taught Hand Expression?: Yes Does the patient have breastfeeding experience prior to this delivery?: Yes  Feeding    LATCH Score/Interventions                      Lactation Tools Discussed/Used     Consult Status Consult Status: Follow-up Date: 05/30/14 Follow-up type: In-patient    Pamelia HoitWeeks, Damyia Strider D 05/30/2014, 2:48 PM

## 2014-05-30 NOTE — Anesthesia Preprocedure Evaluation (Addendum)
Anesthesia Evaluation  Patient identified by MRN, date of birth, ID band Patient awake    Reviewed: Allergy & Precautions, H&P , NPO status , Patient's Chart, lab work & pertinent test results  Airway Mallampati: I TM Distance: >3 FB Neck ROM: full    Dental no notable dental hx.    Pulmonary neg pulmonary ROS,    Pulmonary exam normal       Cardiovascular negative cardio ROS      Neuro/Psych negative neurological ROS     GI/Hepatic negative GI ROS, Neg liver ROS,   Endo/Other  diabetes, Type 1, Insulin Dependent  Renal/GU      Musculoskeletal   Abdominal Normal abdominal exam  (+)   Peds  Hematology negative hematology ROS (+)   Anesthesia Other Findings   Reproductive/Obstetrics (+) Pregnancy                           Anesthesia Physical Anesthesia Plan  ASA: II  Anesthesia Plan: Spinal   Post-op Pain Management:    Induction:   Airway Management Planned:   Additional Equipment:   Intra-op Plan:   Post-operative Plan:   Informed Consent: I have reviewed the patients History and Physical, chart, labs and discussed the procedure including the risks, benefits and alternatives for the proposed anesthesia with the patient or authorized representative who has indicated his/her understanding and acceptance.     Plan Discussed with: Surgeon and CRNA  Anesthesia Plan Comments:         Anesthesia Quick Evaluation

## 2014-05-30 NOTE — Transfer of Care (Signed)
Immediate Anesthesia Transfer of Care Note  Patient: Marie Cook  Procedure(s) Performed: Procedure(s): CESAREAN SECTION (N/A)  Patient Location: PACU  Anesthesia Type:Spinal  Level of Consciousness: awake, alert , oriented and patient cooperative  Airway & Oxygen Therapy: Patient Spontanous Breathing  Post-op Assessment: Report given to PACU RN and Post -op Vital signs reviewed and stable  Post vital signs: Reviewed and stable  Complications: No apparent anesthesia complications

## 2014-05-30 NOTE — Anesthesia Procedure Notes (Addendum)
Spinal  Patient location during procedure: OR Start time: 05/30/2014 9:50 AM End time: 05/30/2014 9:54 AM Reason for block: procedure for pain Staffing Anesthesiologist: Leilani AbleHATCHETT, Garth Diffley Performed by: anesthesiologist  Preanesthetic Checklist Completed: patient identified, surgical consent, pre-op evaluation, timeout performed, IV checked, risks and benefits discussed and monitors and equipment checked Spinal Block Patient position: sitting Prep: site prepped and draped and DuraPrep Patient monitoring: heart rate, cardiac monitor, continuous pulse ox and blood pressure Approach: midline Location: L3-4 Injection technique: single-shot Needle Needle type: Pencan  Needle gauge: 24 G Needle length: 9 cm Needle insertion depth: 6 cm Assessment Sensory level: T4

## 2014-05-30 NOTE — H&P (Signed)
Marie Cook is a 28 y.o. G 2 P 0101 at 38 weeks presents for Repeat LTCS.   1.  Previous C Section x 1 2.  IDDM - well controlled on pump. Followed by Dr. Talmage NapBalan. 3. Hypothyroidism - had TSH markedly elevated in first trimester. Quickly corrected with Cytomel and Synthroid. 4. Developing PIH - BPS 140s / 90s with 2+ proteinuria History of PIH last pregnancy delivered at 35 weeks  History OB History   Grav Para Term Preterm Abortions TAB SAB Ect Mult Living   2 1 0 1 0 0 0 0 0 1      Past Medical History  Diagnosis Date  . HYPERCHOLESTEROLEMIA 08/26/2008  . UNSPECIFIED ANEMIA 10/05/2007  . Anxiety state, unspecified 08/26/2008  . HYPOKALEMIA 07/07/2009  . Pyelonephritis, chronic   . Pneumonia   . MRSA infection     chronic  . Abdominal pain   . Chest pain   . DIABETES MELLITUS, TYPE I 09/1992    Type 1  . HYPOTHYROIDISM 08/26/2008  . Acute pyelonephritis without lesion of renal medullary necrosis   . Fever   . Calculus of kidney     possible per [facility] dated 06/05/12   Past Surgical History  Procedure Laterality Date  . Edg  12/07/2006  . Cesarean section  2011  . Wisdom tooth extraction  2008   Family History: family history includes Heart disease in her father. There is no history of Colon cancer or Breast cancer. Social History:  reports that she has never smoked. She has never used smokeless tobacco. She reports that she does not drink alcohol or use illicit drugs.   Prenatal Transfer Tool  Maternal Diabetes: Yes:  Diabetes Type:  Insulin/Medication controlled Genetic Screening: Normal Maternal Ultrasounds/Referrals: Normal Fetal Ultrasounds or other Referrals:  None Maternal Substance Abuse:  No Significant Maternal Medications:  Meds include: Other: synthroid, insulin Significant Maternal Lab Results:  None Other Comments:  None  Review of Systems  All other systems reviewed and are negative.     Last menstrual period 09/06/2013. Maternal Exam:   Abdomen: Patient reports no abdominal tenderness. Surgical scars: low transverse.   Fetal presentation: vertex     Physical Exam  Nursing note and vitals reviewed. Constitutional: She appears well-developed and well-nourished.  HENT:  Head: Normocephalic.  Eyes: Pupils are equal, round, and reactive to light.  Neck: Normal range of motion.  Cardiovascular: Normal rate and regular rhythm.   Respiratory: Effort normal.  Genitourinary: Vagina normal.    Prenatal labs: ABO, Rh:   Antibody:   Rubella:   RPR:    HBsAg:    HIV:    GBS:     Assessment/Plan: IUP at 38 weeks Previous C Section IDDM Hypothyroidism PIH Repeat LTCS Insulin pump post partum  Sheldon Sem L 05/30/2014, 7:40 AM

## 2014-05-30 NOTE — Brief Op Note (Signed)
05/30/2014  10:53 AM  PATIENT:  Marie Cook  28 y.o. female  PRE-OPERATIVE DIAGNOSIS:   IUP at 38 weeks IDDM Previous C Section LGA PIH    POST-OPERATIVE DIAGNOSIS:   Same  PROCEDURE:  Procedure(s): CESAREAN SECTION (N/A)  SURGEON:  Surgeon(s) and Role:    * Jeani HawkingMichelle L Danial Hlavac, MD - Primary  PHYSICIAN ASSISTANT:   ASSISTANTS: none   ANESTHESIA:   spinal  EBL:  Total I/O In: 3200 [I.V.:3200] Out: 1750 [Urine:850; Blood:900]  BLOOD ADMINISTERED:none  DRAINS: Urinary Catheter (Foley)   LOCAL MEDICATIONS USED:  NONE  SPECIMEN:  No Specimen  DISPOSITION OF SPECIMEN:  N/A  COUNTS:  YES  TOURNIQUET:  * No tourniquets in log *  DICTATION: .Other Dictation: Dictation Number [id number]  PLAN OF CARE: Admit to inpatient   PATIENT DISPOSITION:  PACU - hemodynamically stable.   Delay start of Pharmacological VTE agent (>24hrs) due to surgical blood loss or risk of bleeding: not applicable

## 2014-05-30 NOTE — Anesthesia Postprocedure Evaluation (Signed)
Anesthesia Post Note  Patient: Marie Cook  Procedure(s) Performed: Procedure(s) (LRB): CESAREAN SECTION (N/A)  Anesthesia type: Spinal  Patient location: PACU  Post pain: Pain level controlled  Post assessment: Post-op Vital signs reviewed  Last Vitals:  Filed Vitals:   05/30/14 1130  BP: 132/70  Pulse: 78  Temp:   Resp: 15    Post vital signs: Reviewed  Level of consciousness: awake  Complications: No apparent anesthesia complications

## 2014-05-31 LAB — CBC
HCT: 30 % — ABNORMAL LOW (ref 36.0–46.0)
HEMOGLOBIN: 10.3 g/dL — AB (ref 12.0–15.0)
MCH: 31.8 pg (ref 26.0–34.0)
MCHC: 34.3 g/dL (ref 30.0–36.0)
MCV: 92.6 fL (ref 78.0–100.0)
Platelets: 195 10*3/uL (ref 150–400)
RBC: 3.24 MIL/uL — AB (ref 3.87–5.11)
RDW: 13.9 % (ref 11.5–15.5)
WBC: 11.9 10*3/uL — ABNORMAL HIGH (ref 4.0–10.5)

## 2014-05-31 NOTE — Progress Notes (Signed)
Subjective: Postpartum Day 1: Cesarean Delivery Patient reports tolerating PO, + flatus and no problems voiding.  Patient is monitoring her blood sugars and adjusting her pump without difficulty.   Objective: Vital signs in last 24 hours: Temp:  [97.4 F (36.3 C)-98.7 F (37.1 C)] 98.4 F (36.9 C) (10/17 0600) Pulse Rate:  [65-89] 89 (10/17 0600) Resp:  [15-21] 18 (10/17 0600) BP: (100-149)/(57-90) 128/79 mmHg (10/17 0600) SpO2:  [94 %-99 %] 97 % (10/17 0600) Weight:  [224 lb (101.606 kg)] 224 lb (101.606 kg) (10/16 0818)  Physical Exam:  General: alert, cooperative and appears stated age 73Lochia: appropriate Uterine Fundus: firm Incision: healing well DVT Evaluation: No evidence of DVT seen on physical exam.   Recent Labs  05/30/14 0813  HGB 12.1  HCT 34.5*    Assessment/Plan: Status post Cesarean section. Doing well postoperatively.  Continue current care IDDM - Stable Patient requests circ for baby - consented at bedside.  [place] Dalporto L 05/31/2014, 7:35 AM

## 2014-05-31 NOTE — Progress Notes (Signed)
Acknowledged order for social work consult regarding hx of depression. Met with mother. She denied any hx of depression. No social work needs identified at this time.

## 2014-05-31 NOTE — Lactation Note (Signed)
This note was copied from the chart of Marie Cook. Lactation Consultation Note  Patient Name: Marie Elenor Atlas Today's Date: 05/31/2014  per mom, with my 1st baby I was leaking a large amount of colostrum before I delivered and my milk came in the 1st day after delivery. Per mom  Had no breast changes with this pregnancy that she can recall. Mom expressed feeling of worry due to the difference this time. LC reviewed supply and demand and stressed to mom the importance of extra pumping due to lack of breast changes, use of nipple shield, Discussed with mom and dad adding a SNS over the top of a Nipple shield so baby would not get frustrated and we would be supplementing  With the feeding. @ consult, baby hungry , LC resized mom for a Nipple shield and noted the #20 NS to be to small, #24 Nipple shield alittle loose , but After the baby released perfect size. Added the SNS on top and the baby opened wide , obtained the depth, with SNS , and instilled formula into the top of  the nipple shield. Baby fed 10 mins, prior to this feeding had been to the breast for 4 ml in the NS , and 10 ml from  a bottle , but still seemed hungry. LC encouraged mom to post pump both breast for 10 -15 mins , save  Milk for next feeding.     Maternal Data    Feeding Feeding Type: Breast Fed Length of feed: 10 min  LATCH Score/Interventions Latch: Grasps breast easily, tongue down, lips flanged, rhythmical sucking. Intervention(s): Skin to skin;Teach feeding cues;Waking techniques  Audible Swallowing: Spontaneous and intermittent  Type of Nipple: Everted at rest and after stimulation  Comfort (Breast/Nipple): Soft / non-tender     Hold (Positioning): Assistance needed to correctly position infant at breast and maintain latch. Intervention(s): Skin to skin;Breastfeeding basics reviewed;Support Pillows;Position options  LATCH Score: 9  Lactation Tools Discussed/Used Tools: Supplemental Nutrition  System;Nipple Dorris CarnesShields;Shells;Pump Nipple shield size: 20;24 (re checked the NS size - #42 NS fits best ) Shell Type: Inverted Breast pump type: Other (comment) (started out with hand pump)   Consult Status Consult Status: Follow-up Date: 06/01/14 Follow-up type: In-patient    [name], Yeny Schmoll Ann 05/31/2014, 4:33 PM

## 2014-05-31 NOTE — Plan of Care (Signed)
Problem: Phase II Progression Outcomes Goal: Progress activity as tolerated unless otherwise ordered Outcome: Completed/Met Date Met:  05/31/14 Pt ambulating independently with minimal to no pain.

## 2014-06-01 DIAGNOSIS — Z98891 History of uterine scar from previous surgery: Secondary | ICD-10-CM

## 2014-06-01 LAB — GLUCOSE, CAPILLARY: Glucose-Capillary: 189 mg/dL — ABNORMAL HIGH (ref 70–99)

## 2014-06-01 LAB — BIRTH TISSUE RECOVERY COLLECTION (PLACENTA DONATION)

## 2014-06-01 MED ORDER — IBUPROFEN 600 MG PO TABS: 600.0000 mg | ORAL_TABLET | Freq: Four times a day (QID) | ORAL | Status: DC | PRN

## 2014-06-01 NOTE — Lactation Note (Signed)
This note was copied from the chart of Marie Cook. Lactation Consultation Note  Patient Name: Marie Cook Today's Date: 06/01/2014 Reason for consult: Follow-up assessment (preparing for discharge ) Per [facility] RN - Baby received Bottles all night and today has been to the breast. Per mom the baby has been to the breast  X 2 today with the Nipple shield and using the curved tip syringe to instill formula unto the top.  LC reviewed basics - and stressed the importance of consistent stimulation to the breast with the baby latching and extra  pumping due to the use of the nipple shield. Also continue to use the SNS at the breast to enhance latching and extra stimulation .  LC instructed mom and dad how to use the long term double SNS , and cleaning. Mom requested to rent a DEBP , due to need to  Call insurance company tomorrow Monday for a DEBP. LC reviewed the standard mode of the DEBP Symphony. Per mom aware of how to clean pump pieces. LC had reviewed sore nipple and engorgement prevention and tx yesterday. LC recommended for mom to call LC O/P when her mom came in for an apt. Mother informed of post-discharge support and given phone  number to the lactation department, including services for phone call assistance; out-patient appointments; and breastfeeding support group.  List of other breastfeeding resources in the community given in the handout. Encouraged mother to call for problems or concerns related to breastfeeding.   Maternal Data    Feeding Feeding Type: Breast Milk with Formula added Length of feed: 10 min  LATCH Score/Interventions                Intervention(s): Breastfeeding basics reviewed (see LC note )     Lactation Tools Discussed/Used Pump Review: Setup, frequency, and cleaning;Milk Storage Initiated by:: MAI  Date initiated:: 06/01/14   Consult Status Consult Status: Complete Date: 06/01/14    [name], [name] 06/01/2014, 3:17  PM

## 2014-06-01 NOTE — Progress Notes (Signed)
Subjective: Postpartum Day 2: Cesarean Delivery Patient reports incisional pain and tolerating PO.    Objective: Vital signs in last 24 hours: Temp:  [97.3 F (36.3 C)-97.8 F (36.6 C)] 97.7 F (36.5 C) (10/18 0515) Pulse Rate:  [79-89] 88 (10/18 0515) Resp:  [18] 18 (10/18 0515) BP: (117-131)/(70-80) 117/80 mmHg (10/18 0515) SpO2:  [98 %] 98 % (10/17 0920)  Physical Exam:  General: alert, cooperative and appears stated age Lochia: appropriate Uterine Fundus: firm Incision: healing well, no significant drainage DVT Evaluation: No evidence of DVT seen on physical exam.   Recent Labs  05/30/14 0813 05/31/14 0700  HGB 12.1 10.3*  HCT 34.5* 30.0*    Assessment/Plan: Status post Cesarean section. Doing well postoperatively.  Discharge home with standard precautions and return to clinic in 1 to 2 weeks.  Kennedi Lizardo L 06/01/2014, 8:38 AM

## 2014-06-01 NOTE — Discharge Summary (Signed)
Obstetric Discharge Summary Reason for Admission: cesarean section Prenatal Procedures: none Intrapartum Procedures: cesarean: low cervical, transverse Postpartum Procedures: none Complications-Operative and Postpartum: none Hemoglobin  Date Value Ref Range Status  05/31/2014 10.3* 12.0 - 15.0 g/dL Final     HCT  Date Value Ref Range Status  05/31/2014 30.0* 36.0 - 46.0 % Final    Physical Exam:  General: alert, cooperative and appears stated age 85Lochia: appropriate Uterine Fundus: firm Incision: healing well, no significant drainage, no dehiscence, no significant erythema DVT Evaluation: No evidence of DVT seen on physical exam.  Discharge Diagnoses: Term Pregnancy-delivered  Discharge Information: Date: 06/01/2014 Activity: pelvic rest Diet: routine Medications: Ibuprofen Condition: stable Instructions: refer to practice specific booklet Discharge to: home   Newborn Data: Live born female  Birth Weight: 8 lb 13.4 oz (4009 g) APGAR: 8, 10  Home with mother.  Marie Cook 06/01/2014, 8:39 AM

## 2014-06-02 ENCOUNTER — Encounter (HOSPITAL_COMMUNITY): Payer: Self-pay | Admitting: Obstetrics and Gynecology

## 2014-06-05 ENCOUNTER — Ambulatory Visit (HOSPITAL_COMMUNITY)
Admission: RE | Admit: 2014-06-05 | Discharge: 2014-06-05 | Disposition: A | Discharge status: Home / Self Care | Payer: BC Managed Care – PPO | Source: Ambulatory Visit | Attending: Obstetrics and Gynecology | Admitting: Obstetrics and Gynecology

## 2014-06-05 NOTE — Lactation Note (Signed)
Lactation Consult  Mother's reason for visit: Flat nipples , desire to be able to latch without the nipple shield  Visit Type:  Feeding assessment  Appointment Notes:  Previous NICU Baby, 1st time breast feeding, Nipple shield, help with latching , Confirmed  Consult:  Initial Lactation Consultant:  Kathrin Greathouse  ________________________________________________________________________ Marie Cook Name: Marie Cook  Date of Birth: 05/30/2016  Pediatrician: Dr. Antonieta Pert  Gender: female  Gestational Age: [redacted]w[redacted]d (At Birth)  Birth Weight: 8 lb 13.4 oz (4009 g)  Weight at Discharge: Weight: 8 lb 12 oz (3970 g) Date of Discharge: 06/01/2014  Filed Weights   05/30/14 1020 05/31/14 0019 05/31/14 2310  Weight: 8 lb 13.4 oz (4009 g) 8 lb 13.5 oz (4010 g) 8 lb 12 oz (3970 g)  Last weight taken from location outside of [facility]: 10/20 - 8-9 oz  Location:Pediatrician's office  ( Dr. Antonieta Pert)  Weight today: 8 - 11.9 oz 3966 g     ________________________________________________________________________  Mother's Name: Marie Cook Type of delivery:   Breastfeeding Experience:  Baby had to be supplemented in the hospital due to low blood sugar, and due to flat nipples had to use  a nipple shield , which has been a challenge and frustrating at times for the Baby and me. I really would like to latch without the nipple shield. I also have been wearing the breast shells between feedings, and on Tuesday started Mother's Love and Fenugreek at Dr. Copper suggestion,  because my milk was slow to come in. It was Tuesday when my milk really started coming in. I was working on the latching with the nipple shield  And pumping with the DEBP I rented.  Maternal Medical Conditions:  Diabetes  ( insulin ), Anxiety , Hypothyroidism with tx. Anxiety , Chronic MRSA  Maternal Medications:  Insulin , PNV, Mothers Love , and fenugreek .  LC recommended only taking the Mothers Love ( 3 Herbs-  Fenugreek, Goats Rue, Blessed thistle) only , and not both Fenugreek and the Mothers Love ( it would be to much Fenugreek )   ________________________________________________________________________  Breastfeeding History (Post Discharge)  Frequency of breastfeeding:  About every 3 hours , Baby latched for the 1st time last night without the nipple shield and per mom I was so excited.  Duration of feeding:  10 mins each breast   Pumping - Using the DEBP Medela pump rented form the hospital  How often - every feeding to pre pump off if needed , and pumping if the Baby doesn't latch and getting - 70 ml off each breast   Supplement -  With Alimentum until my milk came in and now only EBM in a Bottle ( using the hospital slow flow nipple )   Infant Intake and Output Assessment  Voids:  6  in 24 hrs.  Color:  Clear yellow Stools:  3  in 24 hrs.  Color:  Yellow orange   ________________________________________________________________________  Maternal Breast Assessment  Breast:  Full ( lateral  Aspects of both breast , filled nodules  Nipple:  Flat Pain level:  0 Pain interventions:  Expressed breast milk  _______________________________________________________________________ Feeding Assessment/Evaluation - alert , awake and rooting , last feeding was at 0930 per mom ( it is now 1330 ), slightly jaundice, mucous membranes moist  Opens mouth wide, no variance   Initial feeding assessment:  Infant's oral assessment:  WNL  Positioning:  Football Right breast  LATCH documentation:  Latch:  2 = Grasps breast easily, tongue down, lips flanged, rhythmical sucking.  Audible swallowing:  2 = Spontaneous and intermittent  Type of nipple:  1 = Flat ( compressible areola)   Comfort (Breast/Nipple):  1 = Filling, red/small blisters or bruises, mild/mod discomfort  Hold (Positioning):  1 = Assistance needed to correctly position  infant at breast and maintain latch  LATCH score:  7  Even though , the last score was only 7, Baby was able to latch with out the nipple shield with latch , steady flow of milk ,  consistent swallowing noted, increased with breast compressions. Baby able soften majority of breast well. Due to mom being so full prior to latch, volume is greater than baby can handle at one feeding.    Attached assessment:  Deep  Lips flanged:  Yes.    Lips untucked:  Yes.    Suck assessment:  Nutritive  Tools:  Mom brought a tool to pull nipple outward , she had ordered on- line ( similar to a mini pump ) ,  LC familiar with tool and showed mom how to use it. Actually worked well to make the nipple more erect for latching  And also expressed off the fullness and the baby latched with assistance and depth obtained. Mom seemed so excited.  Baby fed for 15 plus minutes. Instructed on use and cleaning of tool:  Yes.    Pre-feed weight:  3966 g  (8 lb. 11.9  oz.) Post-feed weight:  4024 g (8  lb. 13.9  oz.) Amount transferred:  58  ml Amount supplemented: none   Additional Feeding Assessment -   Infant's oral assessment:  WNL  Positioning:  Football Left breast  LATCH documentation:  Latch:  2 = Grasps breast easily, tongue down, lips flanged, rhythmical sucking.  Audible swallowing:  2 = Spontaneous and intermittent  Type of nipple:  1 = Flat ( compressible areola)   Comfort (Breast/Nipple):  1 = Filling, red/small blisters or bruises, mild/mod discomfort  ( no sore nipples , just very full breast with lateral nodules )   Hold (Positioning):  1 = Assistance needed to correctly position infant at breast and maintain latch  LATCH score:  7   Even though , the latch was only 7 , was was more independent with latch and the baby opened wide for a deeper latch compared to the 1st breast  Per mom comfortable. Multiply swallows , increased with breast compressions, Fed for only 5 mins and released on his  own . Due to over fullness,  Used hand pump to soften breast down to prevent engorgement due to mom being so full.   Attached assessment:  Deep  Lips flanged:  Yes.    Lips untucked:  Yes.    Suck assessment:  Nutritive  Tools:  Used the nipple stimulator ( mom brought from home and also expressed the fullness off the breast).  Instructed on use and cleaning of tool:  Yes.    Pre-feed weight:  4024 g  (8  lb. 13.9  oz.) Post-feed weight:  4032 g (8 lb. 14.2 oz.) Amount transferred: 8  ml Amount supplemented:  none   Total amount pumped post feed:  L - 8 ml   Total amount transferred:  66 ml  Total supplement given:  None  Lactation Impression : Mom seems very motivated to breast feed now that her milk has come in, also to latch without the nipple shield , which was accomplished at this consult.                                         Mom seems alittle nervous whether it will work , reassured it may take time for the baby to get consistent due to the use of the NS and artificial nipple.                                         Stressed to mom the importance of protecting milk supply and consistency with latching or pumping.   Lactation Plan of care : Praised mom for her efforts breast feeding and pumping                                           See above for LC recommendations for Mother's Love and Fenugreek                                           Also reminded mom to be ware fenugreek can lower her blood sugar and being a Insulin Dependent diabetic                                          She will have to watch her blood sugar and also due to breast feeding requiring more calories will have to adjust according                                          Mom - rest , naps , plenty fluids , especially water, nutritious snacks and meals                                          Important - Areola needs to be compressible ( like a thin sandwich ) for a deep latch                                            Steps for latching - prior to latch - breast massage , hand express, prepump if needed, latch with firm support and breast compressions  Until swallows  and a consistent pattern , and then intermittent.                                           Always soften 1st breast well before offering the 2nd breast. If Ok [place] only feeds 1st breast , release 2nd breast with hand expressing , hand pump ore electric 5-7 mins                                           Baby and me booklet - Engorgement - page 25 , Storage - page 25

## 2014-06-16 ENCOUNTER — Encounter (HOSPITAL_COMMUNITY): Payer: Self-pay | Admitting: Obstetrics and Gynecology

## 2014-06-23 ENCOUNTER — Encounter (HOSPITAL_COMMUNITY)
Admission: RE | Admit: 2014-06-23 | Discharge: 2014-06-23 | Disposition: A | Discharge status: Home / Self Care | Payer: BC Managed Care – PPO | Source: Ambulatory Visit | Attending: Obstetrics and Gynecology | Admitting: Obstetrics and Gynecology

## 2014-06-23 DIAGNOSIS — O923 Agalactia: Secondary | ICD-10-CM | POA: Insufficient documentation

## 2014-07-17 ENCOUNTER — Ambulatory Visit (HOSPITAL_COMMUNITY)
Admission: RE | Admit: 2014-07-17 | Discharge: 2014-07-17 | Disposition: A | Discharge status: Home / Self Care | Payer: BC Managed Care – PPO | Source: Ambulatory Visit | Attending: Obstetrics and Gynecology | Admitting: Obstetrics and Gynecology

## 2014-07-17 NOTE — Lactation Note (Signed)
Lactation Consult  Mother's reason for visit:  Mom is here concerned about low milk supply Visit Type:  Outpatient Appointment Notes:  Pecola LeisureBaby Merrily PewKinston is now 446 weeks old. Mom reports she had abundant milk supply up until about 1-2 weeks ago, but now her breasts are not filling between feedings and she is no longer leaking between or with feedings. Baby is constantly wanting to breastfeed and frantic at the breast when he had been satisfied up until the past 2 weeks.  She reports he is at the breast every 1-2 hours. She had been post pumping after feedings but stopped about 1 week ago because she had milk stored and she felt breastfeeding was going well. Baby has been constipated over the past 1 1/2 weeks, Mom has started giving baby juice to help with bowel movement. Mom reports baby did have stool yesterday. When Mom was pumping she reports receiving 2 oz of breast milk, but she tried to post pump yesterday and received nothing. Mom is using nipple shield to latch.  Consult:  Follow-Up, last visit 06/05/14 Lactation Consultant:  Alfred LevinsGranger, Tyianna Menefee Ann  ________________________________________________________________________    Baby's Name: Magdalen SpatzKinston Kishi Date of Birth: 05/30/2016 Pediatrician: Dr. Excell Seltzerooper - [facility] Gender: female Gestational Age: 4568w0d (At Birth) Birth Weight: 8 lb 13.4 oz (4009 g) Weight at Discharge: Weight: 8 lb 12 oz (3970 g)[facility]   05/30/14 1020 05/31/14 0019 05/31/14 2310  Weight: 8 lb 13.4 oz (4009 g) 8 lb 13.5 oz (4010 g) 8 lb 12 oz (3970 g)   [facility]:At 1 month check up - 10 lb. 5.0 oz Location:Pediatrician's office Weight today: 11 lb. 10.2 oz/5278 gm      ________________________________________________________________________  Mother's Name: Semiyah Mcquigg Type of delivery:  C/S Breastfeeding Experience:  Mom pumped and bottle fed for 6  weeks her 1st child Maternal Medical Conditions:  Diabetes - type 1, Hypothryroid Maternal Medications:  Insulin pump - Humalog, Synthroid, PNV, More Milk Plus by [employer] Mom reports had labs to check thyroid 07/15/14 and were reported to be WNL ________________________________________________________________________  Breastfeeding History (Post Discharge)  Frequency of breastfeeding:  Every 1-2 hours Duration of feeding:  60 minutes  Supplementation  Formula:  Volume 60-120 ml Frequency:  At night Total volume per day:  60-120 ml       Brand: Similac - Alimentum  Breastmilk:  Volume 60-120 ml Frequency:  Last given 07/14/14 Total volume per day:  N/A  Method:  Bottle,   Pumping  Type of pump:  Medela pump in style Frequency:  Stopped pumping 1 week ago, tried yesterday, no breast milk received Volume:  60 ml when she was pumping  Infant Intake and Output Assessment  Voids:  8 in 24 hrs.  Color:  Clear yellow Stools:  0-1 in 24 hrs.  Color:  Brown and Yellow, last stool yesterday. Previous to this baby had stool on Sunday, 07/13/14  ________________________________________________________________________  Maternal Breast Assessment  Breast:  Soft Nipple:  Flat, will become slightly erect with stimulation Pain level:  0 Pain interventions:  N/A  _______________________________________________________________________ Feeding Assessment/Evaluation  Initial feeding assessment:  Infant's oral assessment:  Variance. LC notes upper lip labial tie and short, anterior tongue tie. Some mobility present, chewing motions noted at breast and on bottle with feeding today.   Positioning:  Cross cradle Left breast  LATCH documentation:  Latch:  1 = Repeated attempts needed to sustain latch, nipple held in  mouth throughout feeding, stimulation needed to elicit sucking reflex. Mom tried #24 nipple shield, this was too large, changed to size 20  Audible swallowing:  1 = A few with  stimulation  Type of nipple:  1 = Flat  Comfort (Breast/Nipple):  2 = Soft / non-tender  Hold (Positioning):  1 = Assistance needed to correctly position infant at breast and maintain latch  LATCH score:  6  Attached assessment:  Shallow, Baby very frantic at breast. Takes few attempts to settle him down and for him to organize his suck. Assisted Mom with keeping lips flanged and obtaining more depth w/nipple shield  Lips flanged:  Yes.    Lips untucked:  Yes.    Suck assessment:  Displays both  Tools:  Nipple shield 20 mm Instructed on use and cleaning of tool:  Yes.    Pre-feed weight:  5278 g  (11 lb. 10.2 oz.) Post-feed weight:  5286 g (11 lb. 10.5 oz.) Amount transferred:  8 ml, with nursing for 20 minutes using #20 nipple shield. Scant amount of breast milk visible in the nipple shield.  Amount supplemented:  7 ml of Similac formula given via bottle to settle baby down to latch to right breast.   Additional Feeding Assessment -   Infant's oral assessment:  Variance  Positioning:  Football Right breast  LATCH documentation:  Latch:  1 = Repeated attempts needed to sustain latch, nipple held in mouth throughout feeding, stimulation needed to elicit sucking reflex.  Audible swallowing:  1 = A few with stimulation  Type of nipple:  1 = Flat  Comfort (Breast/Nipple):  2 = Soft / non-tender  Hold (Positioning):  1 = Assistance needed to correctly position infant at breast and maintain latch  LATCH score:  6  Attached assessment:  Deep, once baby able to settle down an organize his suck  Lips flanged:  Yes.    Lips untucked:  Yes.    Suck assessment:  Displays both  Tools:  Nipple shield 16 mm, changed to smaller nipple shield for better fit. Instructed on use and cleaning of tool:  Yes.    Pre-feed weight:  5286 g  (11 lb. 10.5 oz.) Post-feed weight:  5330 g (11 lb. 12.0 oz.) Amount transferred:  44 ml using #16 nipple shield and nursing for 20 minutes. Increased amount  of breast milk visible in nipple shield with size 16 at the end of the feeding.  Amount supplemented:  48 ml   Total amount pumped post feed:  R 1-2 ml    L -  few drops  Total amount transferred:  52 ml Total supplement given:  55 ml  Plan for Mom: Breastfeed whenever baby is hungry but at least every 2-3 hours. Use #16 nipple shield to latch. If this becomes uncomfortable change to size 20 but for now use #16 as this fits better and baby transferred milk better with this size. Keep baby actively nursing for 15  Minutes each breast. Give appetizer if baby too frantic to latch. Supplement every 3 hours with 60+ml of EBM or formula till milk supply returns and baby is satisfied at the breast. Post pump every 3 hours for 15 minutes both breasts to increase milk production. Monitor voids/stools. Continue supplements to increase milk production. Mom considering call OB for RX for Reglan. Used in the past with good results. OP f/u scheduled for Wednesday 07/23/14 at 0900.  Consider having Peds re-evaluate frenulum if interested in revision.

## 2014-07-22 ENCOUNTER — Other Ambulatory Visit: Payer: Self-pay | Admitting: Obstetrics and Gynecology

## 2014-07-23 ENCOUNTER — Ambulatory Visit (HOSPITAL_COMMUNITY): Payer: BC Managed Care – PPO

## 2014-07-23 LAB — CYTOLOGY - PAP

## 2014-09-02 IMAGING — US US ABDOMEN COMPLETE
1 series · 14 of 25 positions shown · non-contrast
Comparison: 09/17/2007

CLINICAL DATA: Right upper quadrant pain

ABDOMINAL ULTRASOUND COMPLETE

[Series 1: us abdomen complete · 0.41mm/px · 14 of 83 slices shown]
[im 1/83]
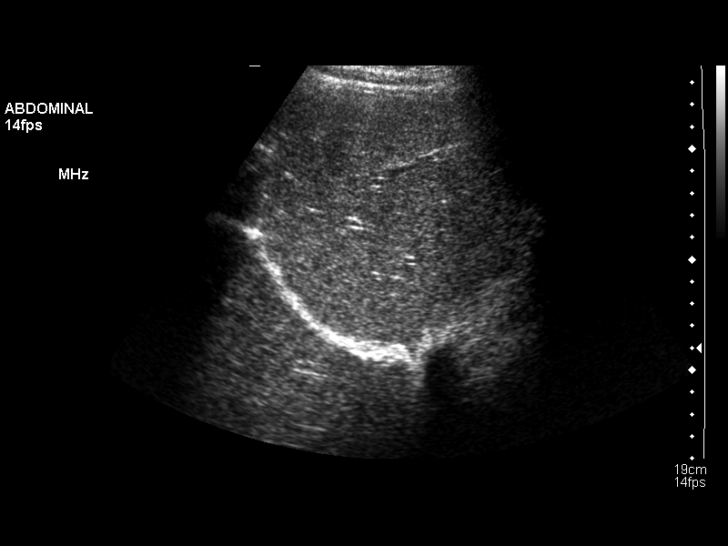
[im 7/83]
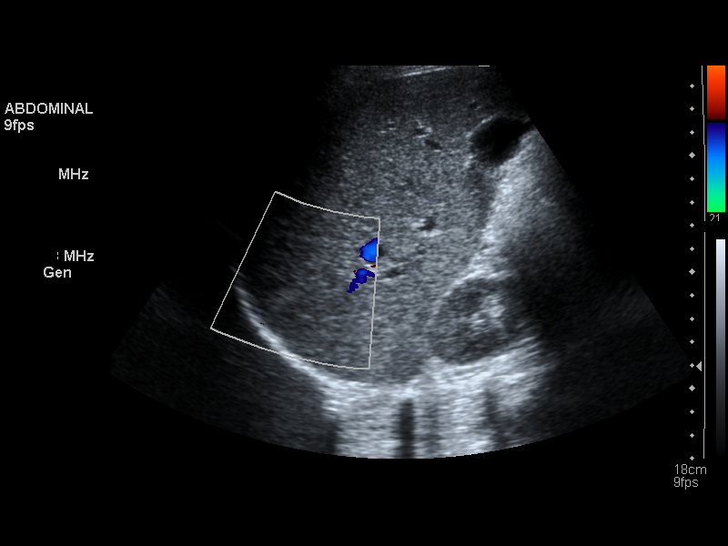
[im 14/83]
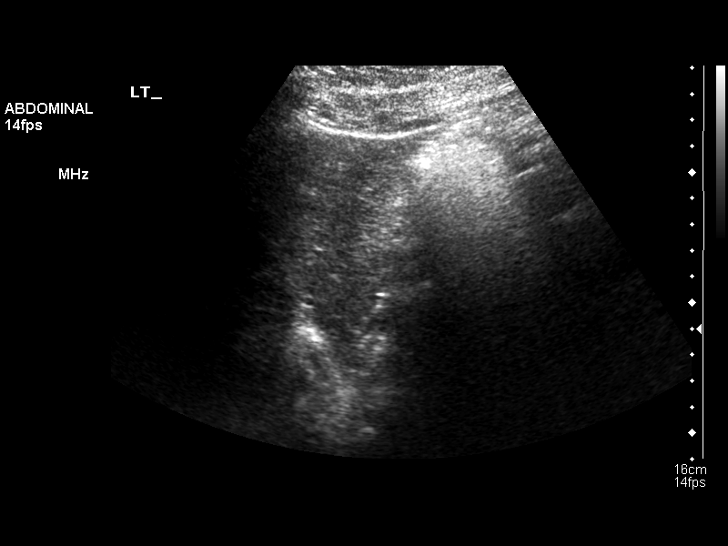
[im 21/83]
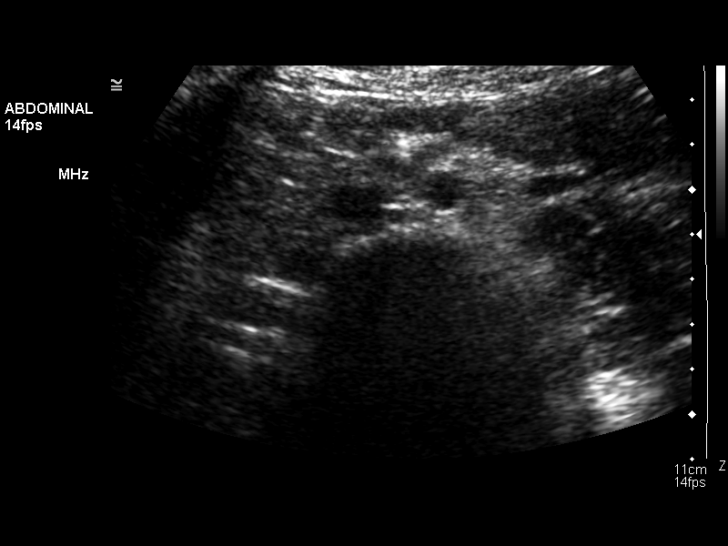
[im 28/83]
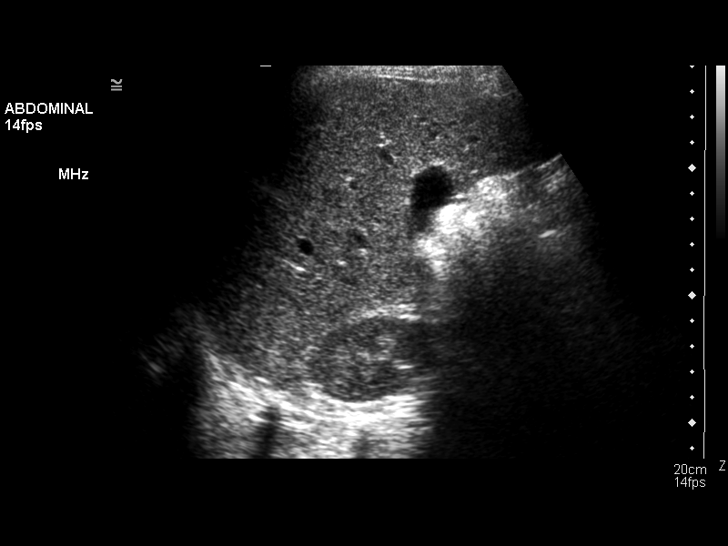
[im 31/83]
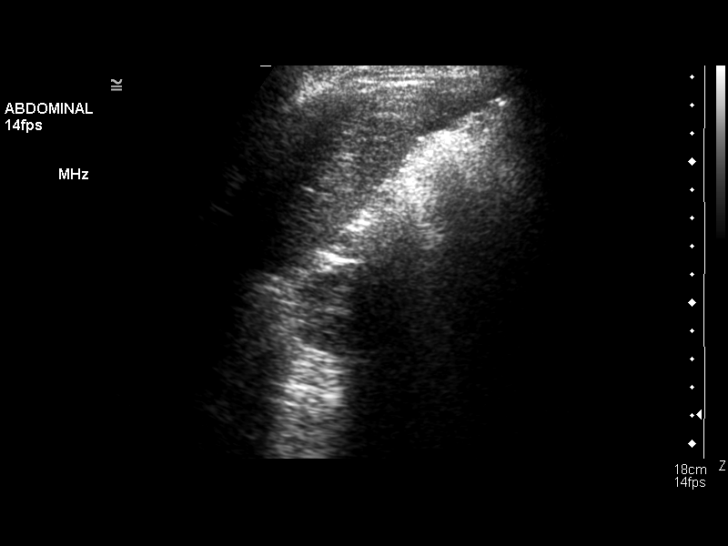
[im 38/83]
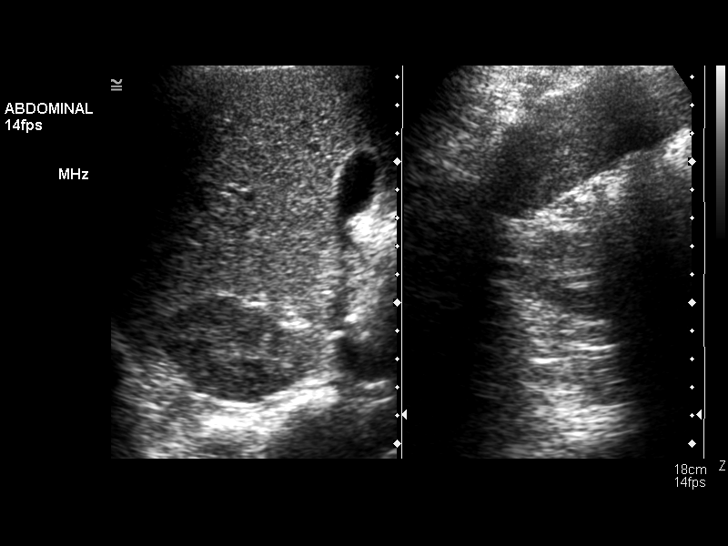
[im 45/83]
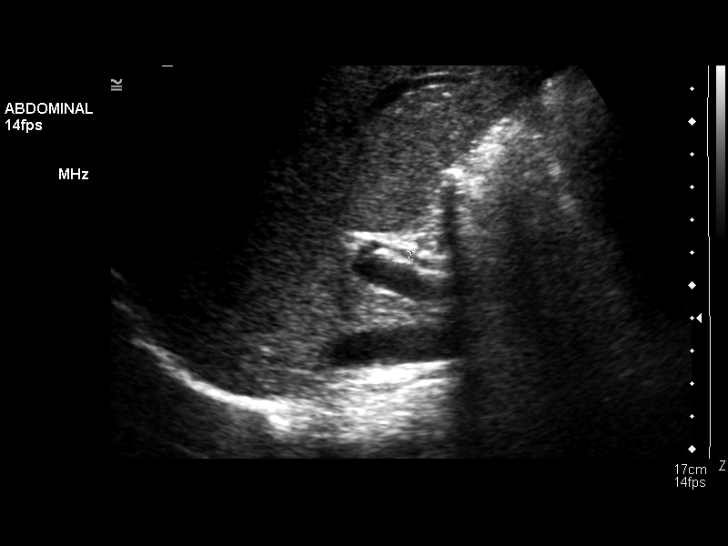
[im 52/83]
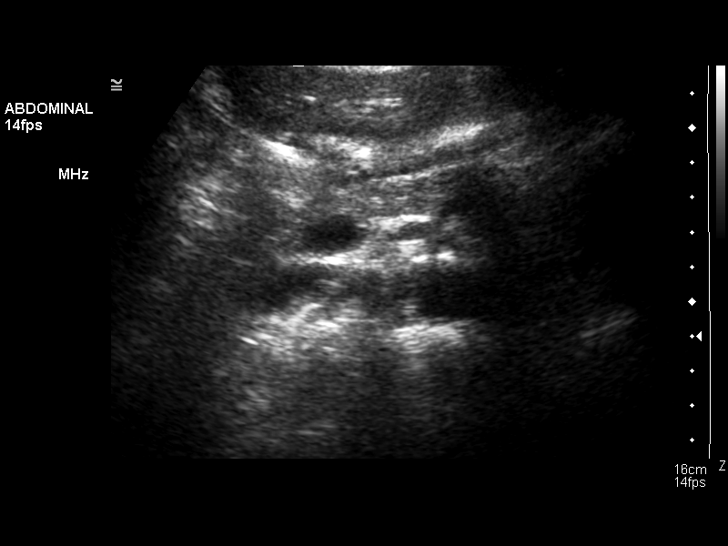
[im 55/83]
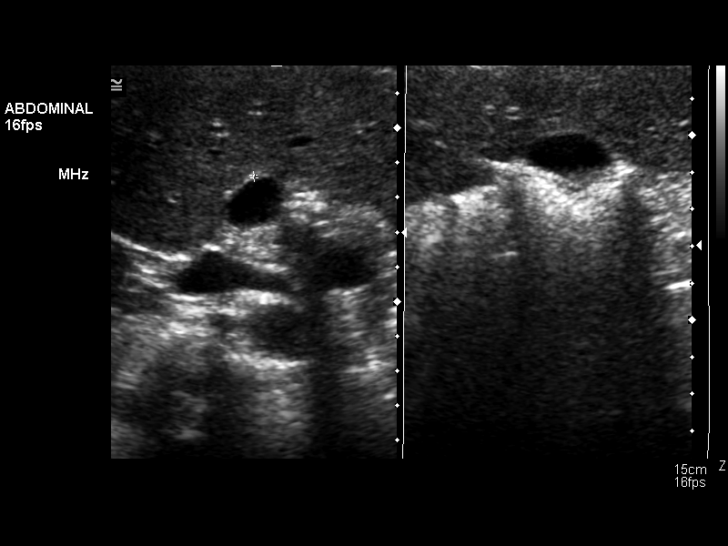
[im 62/83]
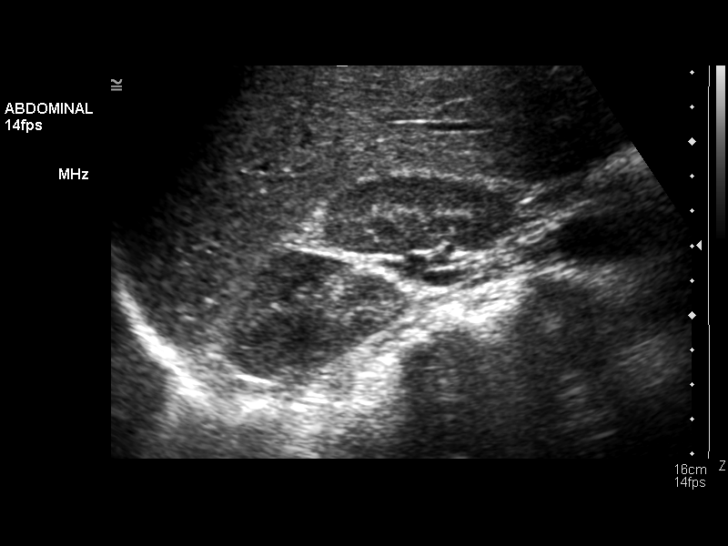
[im 69/83]
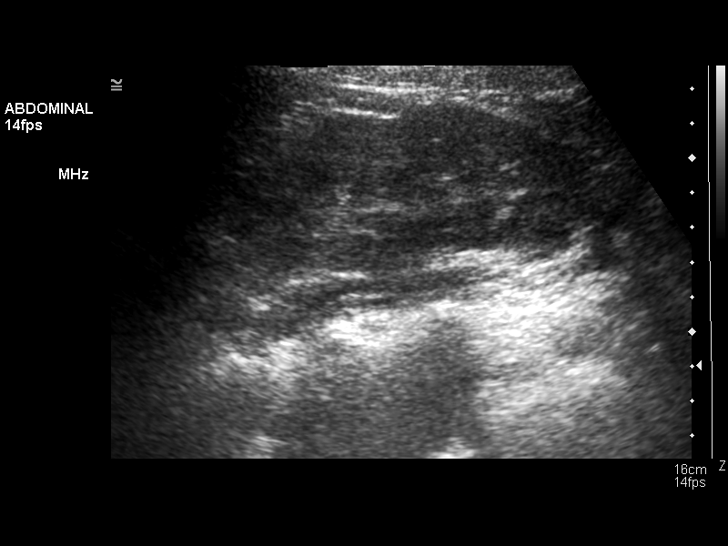
[im 76/83]
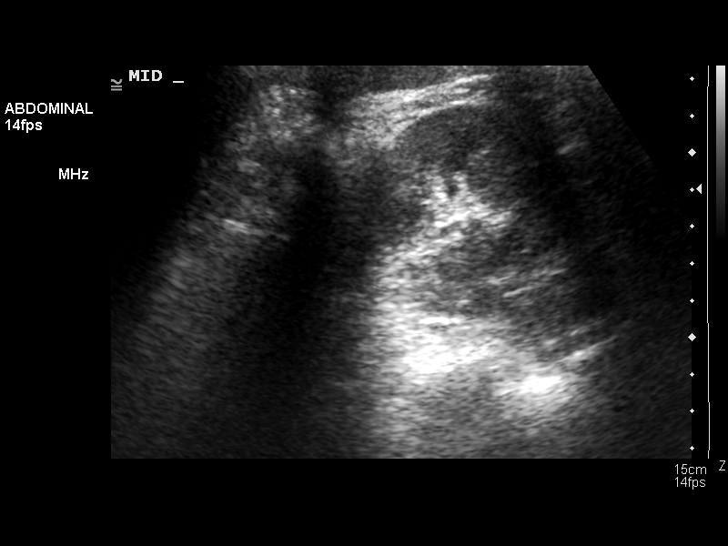
[im 83/83]
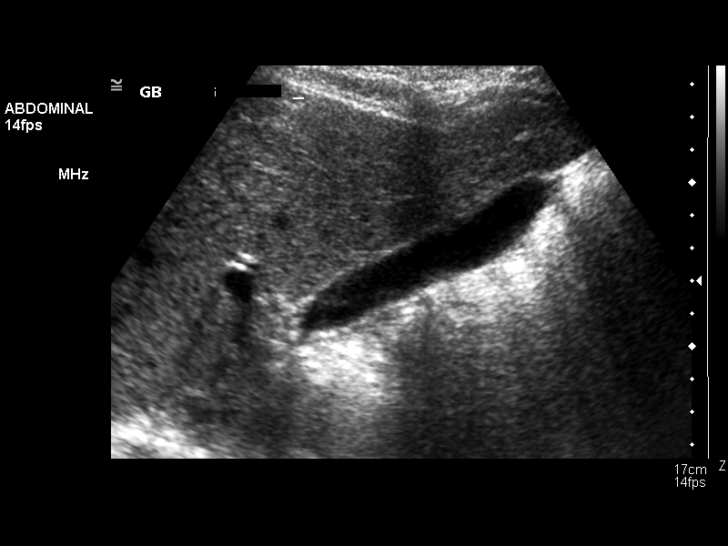

[14 of 25 positions shown; findings below may reference images not displayed]

FINDINGS: Gallbladder:  No gallstones, gallbladder wall thickening, or
pericholecystic fluid.

Common Bile Duct:  Within normal limits in caliber.

Liver: 1.1 cm hyperechoic structure is noted within the posterior
right hepatic lobe.  Liver is otherwise normal in morphology and
echogenicity.

IVC:  Appears normal.

Pancreas:  No abnormality identified.

Spleen:  Within normal limits in size and echotexture.

Right kidney:  Normal in size and parenchymal echogenicity.
Scarring noted within the midportion of the right kidney. No
evidence of mass or hydronephrosis.

Left kidney:  Normal in size and parenchymal echogenicity.  No
evidence of mass or hydronephrosis. There is a nonobstructing stone
within the inferior pole of the left kidney measuring 3.8 mm.

Abdominal Aorta:  No aneurysm identified.
IMPRESSION: 1.  No acute findings.
2.  Echogenic structure within the right hepatic lobe likely
represents a small hemangioma.
3.  Nonobstructing right renal calculus.
4.  No evidence for gallbladder disease.

## 2014-11-29 IMAGING — CR DG CHEST 2V
2 series · 2 of 2 positions shown · non-contrast
Comparison: 09/17/2008

CLINICAL DATA: Chest tightness, pain.

CHEST - 2 VIEW

[w chest pa]
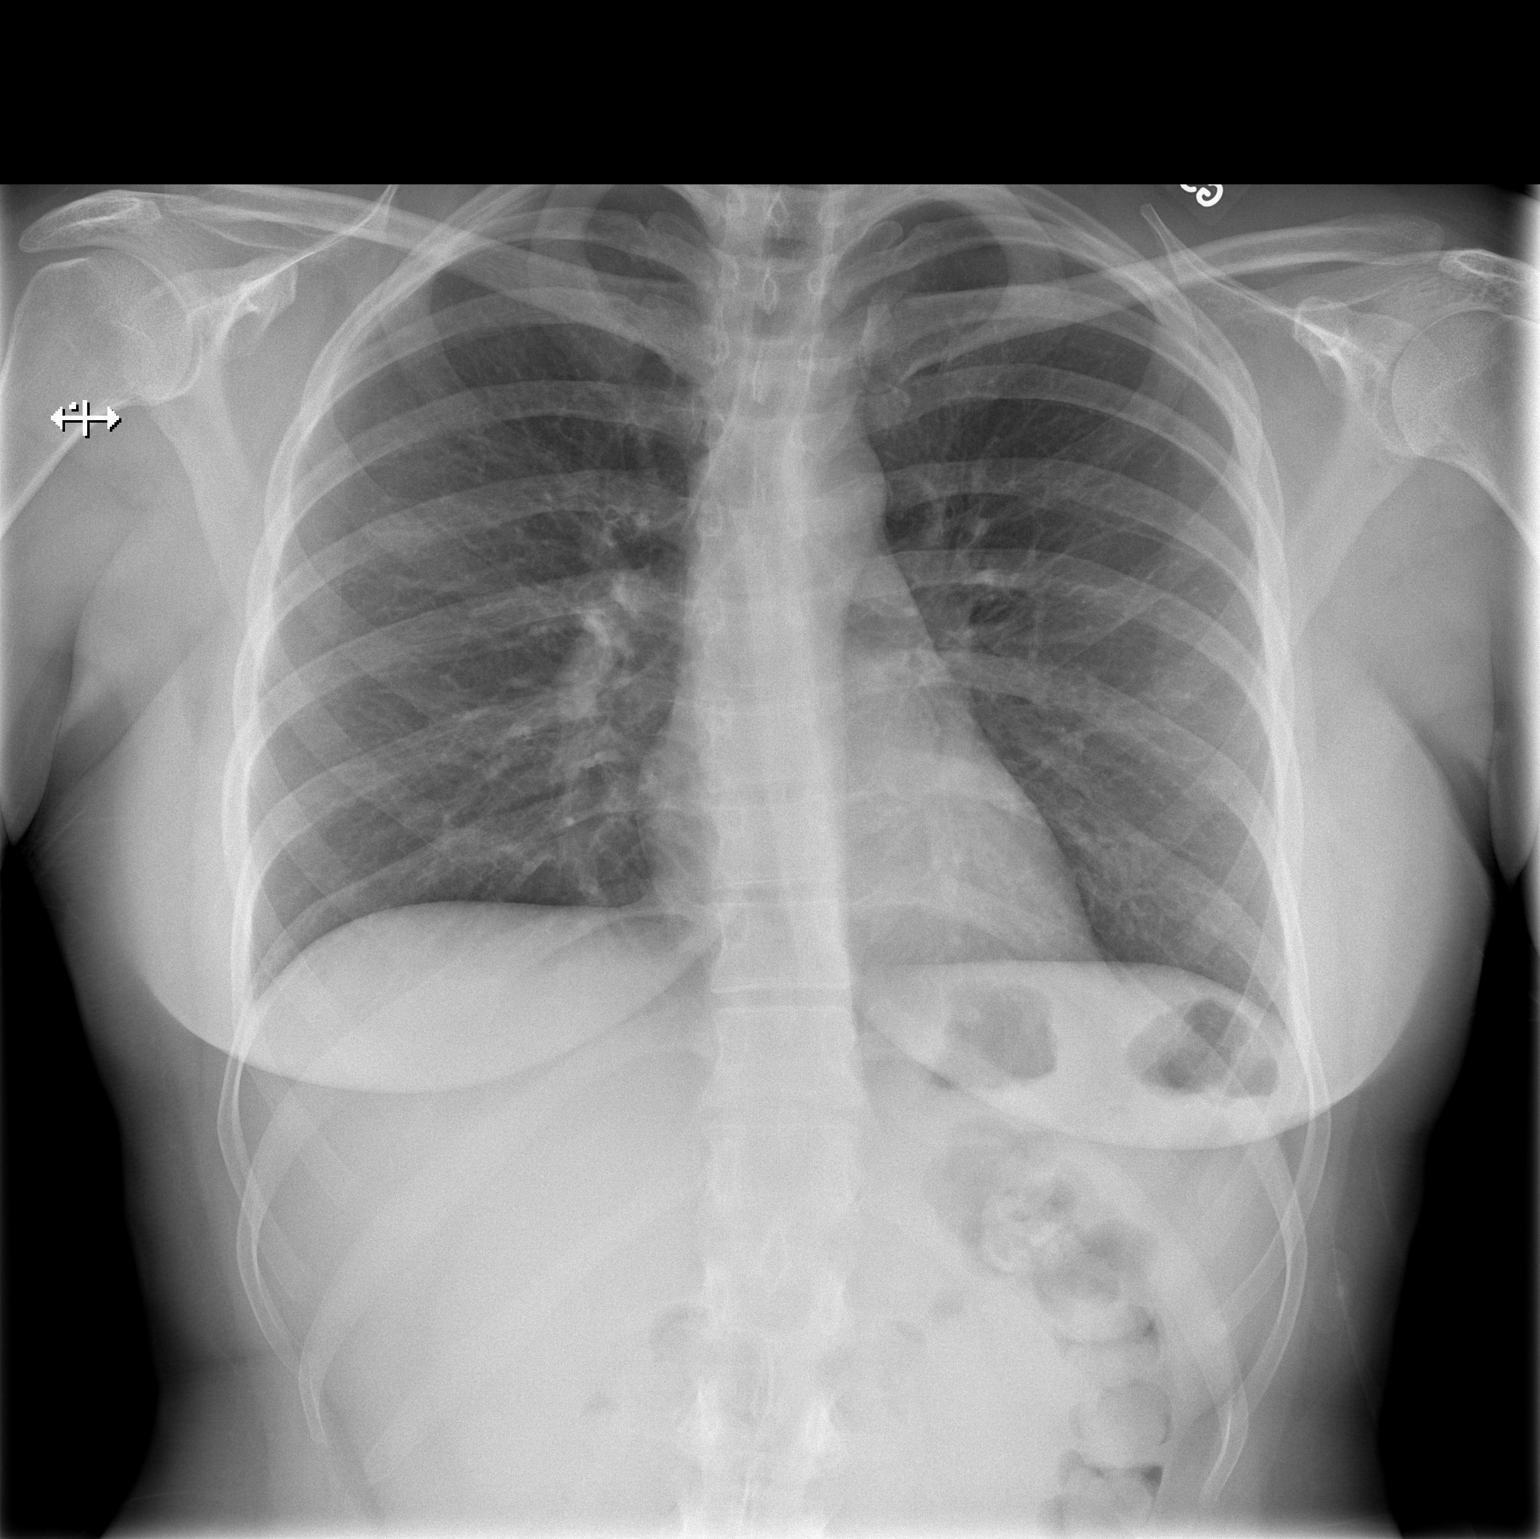

[w chest lat]
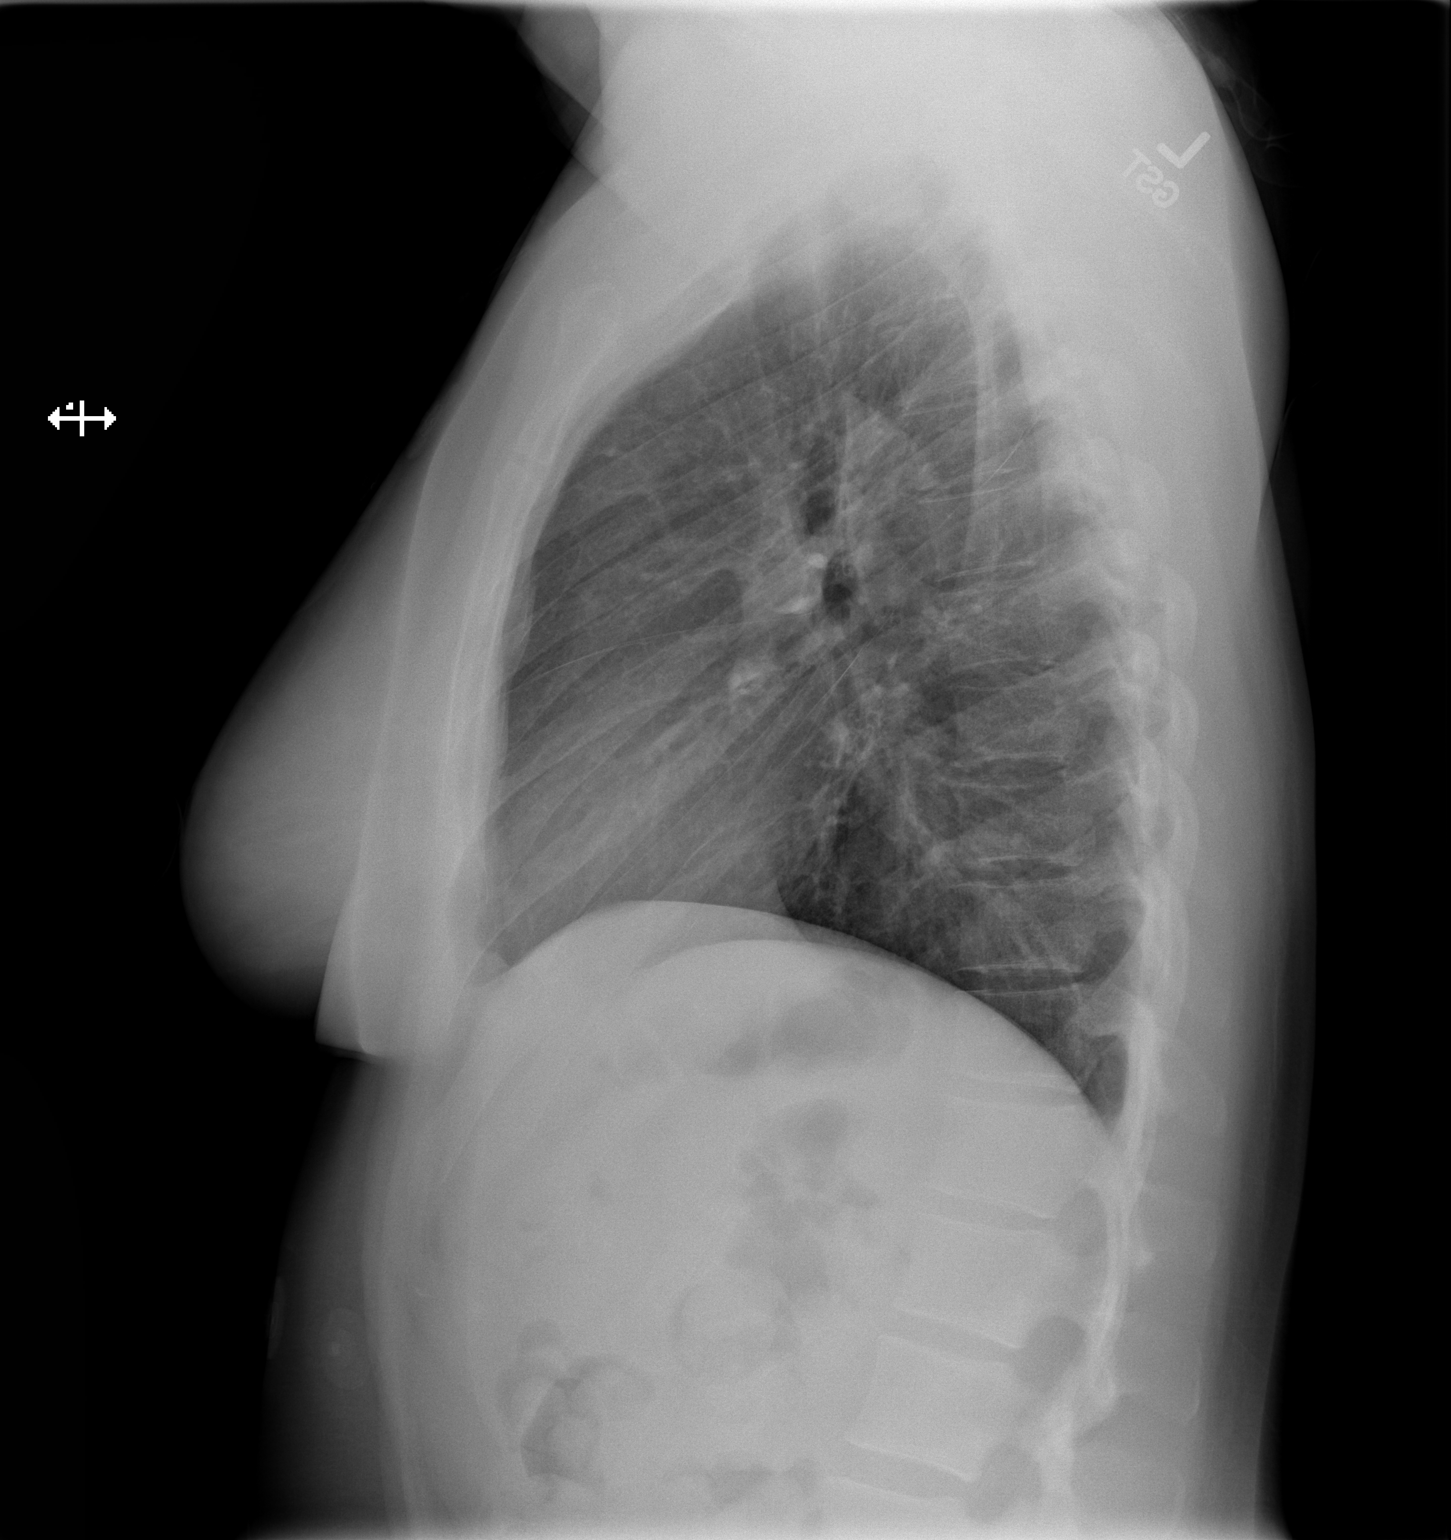

[2 of 2 positions shown; findings below may reference images not displayed]

FINDINGS: Lungs clear.  Heart size and pulmonary vascularity
normal.  No effusion.  Visualized bones unremarkable.
IMPRESSION: No acute disease

## 2015-05-22 ENCOUNTER — Encounter (HOSPITAL_COMMUNITY): Payer: Self-pay | Admitting: *Deleted

## 2015-05-22 ENCOUNTER — Emergency Department (HOSPITAL_COMMUNITY): Payer: BLUE CROSS/BLUE SHIELD

## 2015-05-22 ENCOUNTER — Emergency Department (HOSPITAL_COMMUNITY)
Admission: EM | Admit: 2015-05-22 | Discharge: 2015-05-22 | Disposition: A | Discharge status: Home / Self Care | Payer: BLUE CROSS/BLUE SHIELD | Attending: Emergency Medicine | Admitting: Emergency Medicine

## 2015-05-22 DIAGNOSIS — G44039 Episodic paroxysmal hemicrania, not intractable: Secondary | ICD-10-CM | POA: Insufficient documentation

## 2015-05-22 DIAGNOSIS — Z8742 Personal history of other diseases of the female genital tract: Secondary | ICD-10-CM | POA: Insufficient documentation

## 2015-05-22 DIAGNOSIS — Z8659 Personal history of other mental and behavioral disorders: Secondary | ICD-10-CM | POA: Diagnosis not present

## 2015-05-22 DIAGNOSIS — Z8614 Personal history of Methicillin resistant Staphylococcus aureus infection: Secondary | ICD-10-CM | POA: Diagnosis not present

## 2015-05-22 DIAGNOSIS — Z862 Personal history of diseases of the blood and blood-forming organs and certain disorders involving the immune mechanism: Secondary | ICD-10-CM | POA: Diagnosis not present

## 2015-05-22 DIAGNOSIS — Z8701 Personal history of pneumonia (recurrent): Secondary | ICD-10-CM | POA: Diagnosis not present

## 2015-05-22 DIAGNOSIS — E109 Type 1 diabetes mellitus without complications: Secondary | ICD-10-CM | POA: Diagnosis not present

## 2015-05-22 DIAGNOSIS — E039 Hypothyroidism, unspecified: Secondary | ICD-10-CM | POA: Diagnosis not present

## 2015-05-22 DIAGNOSIS — R52 Pain, unspecified: Secondary | ICD-10-CM | POA: Diagnosis present

## 2015-05-22 DIAGNOSIS — Z794 Long term (current) use of insulin: Secondary | ICD-10-CM | POA: Diagnosis not present

## 2015-05-22 DIAGNOSIS — Z87442 Personal history of urinary calculi: Secondary | ICD-10-CM | POA: Insufficient documentation

## 2015-05-22 LAB — CBC WITH DIFFERENTIAL/PLATELET
BASOS ABS: 0.1 10*3/uL (ref 0.0–0.1)
BASOS PCT: 1 %
Eosinophils Absolute: 0.3 10*3/uL (ref 0.0–0.7)
Eosinophils Relative: 3 %
HCT: 38.7 % (ref 36.0–46.0)
Hemoglobin: 12.8 g/dL (ref 12.0–15.0)
Lymphocytes Relative: 35 %
Lymphs Abs: 3.3 10*3/uL (ref 0.7–4.0)
MCH: 30 pg (ref 26.0–34.0)
MCHC: 33.1 g/dL (ref 30.0–36.0)
MCV: 90.8 fL (ref 78.0–100.0)
MONO ABS: 0.6 10*3/uL (ref 0.1–1.0)
Monocytes Relative: 6 %
NEUTROS ABS: 5.2 10*3/uL (ref 1.7–7.7)
NEUTROS PCT: 55 %
PLATELETS: 360 10*3/uL (ref 150–400)
RBC: 4.26 MIL/uL (ref 3.87–5.11)
RDW: 12.9 % (ref 11.5–15.5)
WBC: 9.4 10*3/uL (ref 4.0–10.5)

## 2015-05-22 LAB — BASIC METABOLIC PANEL
ANION GAP: 8 (ref 5–15)
BUN: 14 mg/dL (ref 6–20)
CALCIUM: 9.1 mg/dL (ref 8.9–10.3)
CO2: 25 mmol/L (ref 22–32)
Chloride: 105 mmol/L (ref 101–111)
Creatinine, Ser: 1.06 mg/dL — ABNORMAL HIGH (ref 0.44–1.00)
Glucose, Bld: 95 mg/dL (ref 65–99)
Potassium: 3.8 mmol/L (ref 3.5–5.1)
Sodium: 138 mmol/L (ref 135–145)

## 2015-05-22 LAB — I-STAT BETA HCG BLOOD, ED (MC, WL, AP ONLY)

## 2015-05-22 MED ORDER — DIPHENHYDRAMINE HCL 50 MG/ML IJ SOLN
25.0000 mg | Freq: Once | INTRAMUSCULAR | Status: AC
  Administered 2015-05-22: 25 mg via INTRAVENOUS
  Filled 2015-05-22: qty 1

## 2015-05-22 MED ORDER — KETOROLAC TROMETHAMINE 30 MG/ML IJ SOLN
30.0000 mg | Freq: Once | INTRAMUSCULAR | Status: AC
  Administered 2015-05-22: 30 mg via INTRAVENOUS
  Filled 2015-05-22: qty 1

## 2015-05-22 MED ORDER — SUMATRIPTAN SUCCINATE 100 MG PO TABS: 100.0000 mg | ORAL_TABLET | ORAL | Status: DC | PRN

## 2015-05-22 MED ORDER — KETOROLAC TROMETHAMINE 60 MG/2ML IM SOLN
30.0000 mg | Freq: Once | INTRAMUSCULAR | Status: DC
  Filled 2015-05-22: qty 2

## 2015-05-22 MED ORDER — SODIUM CHLORIDE 0.9 % IV BOLUS (SEPSIS)
1000.0000 mL | Freq: Once | INTRAVENOUS | Status: AC
  Administered 2015-05-22: 1000 mL via INTRAVENOUS

## 2015-05-22 MED ORDER — METOCLOPRAMIDE HCL 5 MG/ML IJ SOLN
10.0000 mg | Freq: Once | INTRAMUSCULAR | Status: AC
  Administered 2015-05-22: 10 mg via INTRAVENOUS
  Filled 2015-05-22: qty 2

## 2015-05-22 NOTE — ED Notes (Signed)
The pt has had a headache for 4 weeks  She has been seen at urgent cares   lmp sept 7th

## 2015-05-22 NOTE — Discharge Instructions (Signed)

## 2015-05-22 NOTE — ED Notes (Signed)
Vital signs stable. 

## 2015-05-23 NOTE — ED Provider Notes (Signed)
CSN: [id number]     Arrival date & time 05/22/15  1600 History   First MD Initiated Contact with Patient 05/22/15 1657     Chief Complaint  Patient presents with  . Headache     (Consider location/radiation/quality/duration/timing/severity/associated sxs/prior Treatment) HPI Comments: Patient reports persistent headache for the last 4 weeks. Has been evaluated several times at various urgent cares.  She tries to avoid and will not take most pain medication as she is currently nursing a one year old. She is concerned she may have something bad going on in her head.   Patient is a 29 y.o. female presenting with headaches. The history is provided by the patient. No language interpreter was used.  Headache Pain location:  L parietal, R parietal and occipital Quality:  Dull Severity currently:  8/10 Severity at highest:  10/10 Onset quality:  Gradual Duration:  4 weeks Timing:  Intermittent Progression:  Waxing and waning Chronicity:  Recurrent Similar to prior headaches: no   Relieved by:  Nothing Associated symptoms: no back pain, no dizziness, no fever, no focal weakness, no loss of balance, no paresthesias, no photophobia and no weakness     Past Medical History  Diagnosis Date  . HYPERCHOLESTEROLEMIA 08/26/2008  . UNSPECIFIED ANEMIA 10/05/2007  . Anxiety state, unspecified 08/26/2008  . HYPOKALEMIA 07/07/2009  . Pyelonephritis, chronic   . Pneumonia   . MRSA infection     chronic  . Abdominal pain   . Chest pain   . DIABETES MELLITUS, TYPE I 09/1992    Type 1  . HYPOTHYROIDISM 08/26/2008  . Acute pyelonephritis without lesion of renal medullary necrosis   . Fever   . Calculus of kidney     possible per [facility] dated 06/05/12   Past Surgical History  Procedure Laterality Date  . Edg  12/07/2006  . Cesarean section  2011  . Wisdom tooth extraction  2008  . Cesarean section N/A 05/30/2014    Procedure: CESAREAN SECTION;  Surgeon: Jeani Hawking, MD;   Location: [facility];  Service: Obstetrics;  Laterality: N/A;   Family History  Problem Relation Age of Onset  . Colon cancer Neg Hx   . Breast cancer Neg Hx   . Heart disease Father    Social History  Substance Use Topics  . Smoking status: Never Smoker   . Smokeless tobacco: Never Used  . Alcohol Use: No   OB History    Gravida Para Term Preterm AB TAB SAB Ectopic Multiple Living   0 0 0 0 0 2     Review of Systems  Constitutional: Negative for fever.  Eyes: Negative for photophobia.  Musculoskeletal: Negative for back pain.  Neurological: Positive for headaches. Negative for dizziness, focal weakness, weakness, paresthesias and loss of balance.  All other systems reviewed and are negative.     Allergies  Oseltamivir phosphate  Home Medications   Prior to Admission medications   Medication Sig Start Date End Date Taking? Authorizing Provider  acetaminophen (TYLENOL) 500 MG tablet Take 1,000 mg by mouth every 6 (six) hours as needed for headache.   Yes Historical Provider, MD  Insulin Human (INSULIN PUMP) SOLN Inject 1 each into the skin continuous. "Humalog"   Yes Historical Provider, MD  insulin lispro (HUMALOG) 100 UNIT/ML injection Inject into the skin continuous. Insulin pump   Yes Historical Provider, MD  levothyroxine (SYNTHROID, LEVOTHROID) 100 MCG tablet Take 100 mcg by mouth daily before breakfast.   Yes  Historical Provider, MD  ibuprofen (ADVIL,MOTRIN) 600 MG tablet Take 1 tablet (600 mg total) by mouth every 6 (six) hours as needed for mild pain. 06/01/14   Marcelle Overlie, MD  Insulin Pump Disposable (OMNIPOD) MISC 1 pod every day 12/26/11   Romero Belling, MD  SUMAtriptan (IMITREX) 100 MG tablet Take 1 tablet (100 mg total) by mouth every 2 (two) hours as needed for migraine. May repeat in 2 hours if headache persists or recurs. 05/22/15   Felicie Morn, NP   BP 117/80 mmHg  Pulse 77  Temp(Src) 98.1 F (36.7 C) (Oral)  Resp 14  Ht  (1.702 m)  Wt 199  lb 9 oz (90.521 kg)  BMI 31.25 kg/m2  SpO2 99%  LMP 04/22/2015  Breastfeeding? Yes Physical Exam  Constitutional: She is oriented to person, place, and time. She appears well-developed and well-nourished.  HENT:  Head: Normocephalic.  Eyes: Conjunctivae and EOM are normal.  Neck: Normal range of motion. Neck supple.  Cardiovascular: Normal rate and regular rhythm.   Pulmonary/Chest: Effort normal and breath sounds normal.  Abdominal: Soft. Bowel sounds are normal.  Musculoskeletal: She exhibits no edema or tenderness.  Lymphadenopathy:    She has no cervical adenopathy.  Neurological: She is alert and oriented to person, place, and time. She has normal strength. No cranial nerve deficit or sensory deficit. Coordination normal. GCS eye subscore is 4. GCS verbal subscore is 5. GCS motor subscore is 6.  Skin: Skin is warm and dry.  Psychiatric: She has a normal mood and affect.  Nursing note and vitals reviewed.   ED Course  Procedures (including critical care time) Labs Review Labs Reviewed  BASIC METABOLIC PANEL - Abnormal; Notable for the following:    Creatinine, Ser 1.06 (*)    All other components within normal limits  CBC WITH DIFFERENTIAL/PLATELET  I-STAT BETA HCG BLOOD, ED ([facility], [facility], [facility] ONLY)    Imaging Review Ct Head Wo Contrast  05/22/2015   CLINICAL DATA:  Daily headache for the last 4 weeks. The headache occurs in different areas.  EXAM: CT HEAD WITHOUT CONTRAST  TECHNIQUE: Contiguous axial images were obtained from the base of the skull through the vertex without intravenous contrast.  COMPARISON:  None.  FINDINGS: The brain has a normal appearance without evidence of malformation, atrophy, old or acute infarction, mass lesion, hemorrhage, hydrocephalus or extra-axial collection. The calvarium is unremarkable. The paranasal sinuses, middle ears and mastoids are clear.  IMPRESSION: Normal head CT.  No cause of headache identified.   Electronically Signed   By: Paulina Fusi  M.D.   On: 05/22/2015 21:06   I have personally reviewed and evaluated these images and lab results as part of my medical decision-making.   EKG Interpretation None     Radiology results reviewed and shared with patient. Headache. No red flag symptoms. Received IV fluids and migraine cocktail in ED. Home with prescription for imitrex. Return precautions discussed. Follow-up with PCP.  MDM   Final diagnoses:  Episodic paroxysmal hemicrania, not intractable        Felicie Morn, NP 05/23/15 0109  Tilden Fossa, MD 05/23/15 [phone]

## 2016-05-02 DIAGNOSIS — E039 Hypothyroidism, unspecified: Secondary | ICD-10-CM | POA: Diagnosis not present

## 2016-05-18 DIAGNOSIS — E1065 Type 1 diabetes mellitus with hyperglycemia: Secondary | ICD-10-CM | POA: Diagnosis not present

## 2016-06-05 DIAGNOSIS — N3001 Acute cystitis with hematuria: Secondary | ICD-10-CM | POA: Diagnosis not present

## 2016-06-05 DIAGNOSIS — R300 Dysuria: Secondary | ICD-10-CM | POA: Diagnosis not present

## 2016-06-20 DIAGNOSIS — E1065 Type 1 diabetes mellitus with hyperglycemia: Secondary | ICD-10-CM | POA: Diagnosis not present

## 2016-06-23 DIAGNOSIS — E1065 Type 1 diabetes mellitus with hyperglycemia: Secondary | ICD-10-CM | POA: Diagnosis not present

## 2016-07-26 DIAGNOSIS — L0390 Cellulitis, unspecified: Secondary | ICD-10-CM | POA: Diagnosis not present

## 2016-07-26 DIAGNOSIS — L0201 Cutaneous abscess of face: Secondary | ICD-10-CM | POA: Diagnosis not present

## 2016-07-29 DIAGNOSIS — L0211 Cutaneous abscess of neck: Secondary | ICD-10-CM | POA: Diagnosis not present

## 2016-08-15 DIAGNOSIS — J069 Acute upper respiratory infection, unspecified: Secondary | ICD-10-CM | POA: Diagnosis not present

## 2016-08-23 DIAGNOSIS — E039 Hypothyroidism, unspecified: Secondary | ICD-10-CM | POA: Diagnosis not present

## 2016-08-23 DIAGNOSIS — E109 Type 1 diabetes mellitus without complications: Secondary | ICD-10-CM | POA: Diagnosis not present

## 2016-08-23 DIAGNOSIS — Z9641 Presence of insulin pump (external) (internal): Secondary | ICD-10-CM | POA: Diagnosis not present

## 2016-09-19 DIAGNOSIS — E1065 Type 1 diabetes mellitus with hyperglycemia: Secondary | ICD-10-CM | POA: Diagnosis not present

## 2016-10-05 DIAGNOSIS — E039 Hypothyroidism, unspecified: Secondary | ICD-10-CM | POA: Diagnosis not present

## 2016-10-05 DIAGNOSIS — N390 Urinary tract infection, site not specified: Secondary | ICD-10-CM | POA: Diagnosis not present

## 2016-10-11 DIAGNOSIS — Z6832 Body mass index (BMI) 32.0-32.9, adult: Secondary | ICD-10-CM | POA: Diagnosis not present

## 2016-10-11 DIAGNOSIS — N390 Urinary tract infection, site not specified: Secondary | ICD-10-CM | POA: Diagnosis not present

## 2016-10-11 DIAGNOSIS — R42 Dizziness and giddiness: Secondary | ICD-10-CM | POA: Diagnosis not present

## 2016-10-11 DIAGNOSIS — Z01419 Encounter for gynecological examination (general) (routine) without abnormal findings: Secondary | ICD-10-CM | POA: Diagnosis not present

## 2016-10-18 DIAGNOSIS — E103512 Type 1 diabetes mellitus with proliferative diabetic retinopathy with macular edema, left eye: Secondary | ICD-10-CM | POA: Diagnosis not present

## 2016-10-24 DIAGNOSIS — E1065 Type 1 diabetes mellitus with hyperglycemia: Secondary | ICD-10-CM | POA: Diagnosis not present

## 2016-10-27 DIAGNOSIS — G44201 Tension-type headache, unspecified, intractable: Secondary | ICD-10-CM | POA: Diagnosis not present

## 2016-10-27 DIAGNOSIS — R42 Dizziness and giddiness: Secondary | ICD-10-CM | POA: Diagnosis not present

## 2016-10-27 DIAGNOSIS — H6123 Impacted cerumen, bilateral: Secondary | ICD-10-CM | POA: Diagnosis not present

## 2016-11-02 ENCOUNTER — Ambulatory Visit (INDEPENDENT_AMBULATORY_CARE_PROVIDER_SITE_OTHER): Payer: BLUE CROSS/BLUE SHIELD | Admitting: Neurology

## 2016-11-02 ENCOUNTER — Encounter: Payer: Self-pay | Admitting: Neurology

## 2016-11-02 DIAGNOSIS — R42 Dizziness and giddiness: Secondary | ICD-10-CM

## 2016-11-02 DIAGNOSIS — E109 Type 1 diabetes mellitus without complications: Secondary | ICD-10-CM | POA: Diagnosis not present

## 2016-11-02 NOTE — Progress Notes (Signed)
PATIENT: Marie Cook DOB: [birth year]  Chief Complaint  Patient presents with  . Dizziness    Orthostatic Vitals: Lying: 124/84, 79, Sitting: 152/99, 83, Standing: 138/100, 93, Standing at 3 minutes: 139/106, 104 .  Reports feeling light-headed for the last 3-4 weeks.  Symptoms seem to worsen the longer she stands up but no real changes noted with sudden positional changes.  She also has pressure/pain in the back of her head, worse of right side.    Marie FramesBindubal Balan, MD, endocrinologist   . Obstetrics and Gynecology    Marie OverlieMichelle Grewal, MD  . ENT    Marie Halonhristopher E Newman, MD - referring MD     HISTORICAL  Marie Cook  is 31 years old right-handed female, seen in refer by her ENT doctor Marie Cook for evaluation of dizziness, her primary care/gynecologist is Dr.Michelle Vincente PoliGrewal, her endocrinologist is Dr.,Bindubal Talmage NapBalan, initial evaluation is on November 02 2016.  She had type 1 diabetes since age 45, she was treated with insulin, had a insulin pump since 2010, overall her diabetes  is under excellent control with exception of mild elevated glucose with occasionally UTI, she also reported of previous prolonged underdiagnosed UTI, eventually was treated successfully. Hypothyroidism on supplement   She noticed gradual onset dizziness difficulty focusing since September 28 2016, she remembers she was decorating the room, noticed difficulty focusing, everything seems to be floating, symptoms has been persistent even when she was sitting down, lying down, she denies spinning sensation, she was seen by ophthalmologist initially, there was a suspicious for diabetic retinopathy, but later was refuted by second opinion from different retinal specialist  She denies double vision, noticed mild decreased acuity on the right vision, but her sensation of difficulty focusing, still present with monocular vision with either left or right eye. She denies gait abnormality,   She also developed frequent  headaches since March 2018, right occipital area, spreading forward, pressure, no significant light noise sensitivity, no nauseous  REVIEW OF SYSTEMS: Full 14 system review of systems performed and notable only for blurred vision,  ALLERGIES: Allergies  Allergen Reactions  . Oseltamivir Phosphate Rash    HOME MEDICATIONS: Current Outpatient Prescriptions  Medication Sig Dispense Refill  . ARMOUR THYROID 90 MG tablet Take 90 mg by mouth daily.  5  . insulin lispro (HUMALOG) 100 UNIT/ML injection Inject into the skin continuous. Insulin pump    . Insulin Pump Disposable (OMNIPOD) MISC 1 pod every day 90 each 3   No current facility-administered medications for this visit.     PAST MEDICAL HISTORY: Past Medical History:  Diagnosis Date  . Abdominal pain   . Acute pyelonephritis without lesion of renal medullary necrosis   . Anxiety state, unspecified 08/26/2008  . Calculus of kidney    possible per [facility] dated 06/05/12  . Chest pain   . DIABETES MELLITUS, TYPE I 09/1992   Type 1  . Fever   . HYPERCHOLESTEROLEMIA 08/26/2008  . HYPOKALEMIA 07/07/2009  . HYPOTHYROIDISM 08/26/2008  . MRSA infection    chronic  . Pneumonia   . Pressure in head   . Pyelonephritis, chronic   . UNSPECIFIED ANEMIA 10/05/2007    PAST SURGICAL HISTORY: Past Surgical History:  Procedure Laterality Date  . CESAREAN SECTION  2011  . CESAREAN SECTION N/A 05/30/2014   Procedure: CESAREAN SECTION;  Surgeon: Marie HawkingMichelle L Grewal, MD;  Location: [facility];  Service: Obstetrics;  Laterality: N/A;  . EDG  12/07/2006  . WISDOM TOOTH EXTRACTION  2008    FAMILY HISTORY: Family History  Problem Relation Age of Onset  . Healthy Mother   . Heart disease Father   . Colon cancer Neg Hx   . Breast cancer Neg Hx     SOCIAL HISTORY:  Social History   Social History  . Marital status: Married    Spouse name: N/A  . Number of children: 2  . Years of education: Bachelors   Occupational History  .  homemaker    Social History Main Topics  . Smoking status: Never Smoker  . Smokeless tobacco: Never Used  . Alcohol use No  . Drug use: No  . Sexual activity: Not on file   Other Topics Concern  . Not on file   Social History Narrative   Lives at home with husband and two children.   Right-handed.   1 caffeine drink per week.     PHYSICAL EXAM   Vitals:   11/02/16 1440  BP: 124/84  Pulse: 79  Weight: 208 lb (94.3 kg)  Height: 5\' 7"  (1.702 m)    Not recorded      Body mass index is 32.58 kg/m.  PHYSICAL EXAMNIATION:  Gen: NAD, conversant, well nourised, obese, well groomed                     Cardiovascular: Regular rate rhythm, no peripheral edema, warm, nontender. Eyes: Conjunctivae clear without exudates or hemorrhage Neck: Supple, no carotid bruits. Pulmonary: Clear to auscultation bilaterally   NEUROLOGICAL EXAM:  MENTAL STATUS: Speech:    Speech is normal; fluent and spontaneous with normal comprehension.  Cognition:     Orientation to time, place and person     Normal recent and remote memory     Normal Attention span and concentration     Normal Language, naming, repeating,spontaneous speech     Fund of knowledge   CRANIAL NERVES: CN II: Visual fields are full to confrontation. Fundoscopic exam is normal with sharp discs and no vascular changes. Pupils are round equal and briskly reactive to light. CN III, IV, VI: extraocular movement are normal. No ptosis. CN V: Facial sensation is intact to pinprick in all 3 divisions bilaterally. Corneal responses are intact.  CN VII: Face is symmetric with normal eye closure and smile. CN VIII: Hearing is normal to rubbing fingers CN IX, X: Palate elevates symmetrically. Phonation is normal. CN XI: Head turning and shoulder shrug are intact CN XII: Tongue is midline with normal movements and no atrophy.  MOTOR: There is no pronator drift of out-stretched arms. Muscle bulk and tone are normal. Muscle  strength is normal.  REFLEXES: Reflexes are 2+ and symmetric at the biceps, triceps, knees, and ankles. Plantar responses are flexor.  SENSORY: Intact to light touch, pinprick, positional sensation and vibratory sensation are intact in fingers and toes.  COORDINATION: Rapid alternating movements and fine finger movements are intact. There is no dysmetria on finger-to-nose and heel-knee-shin.    GAIT/STANCE: Posture is normal. Gait is steady with normal steps, base, arm swing, and turning. Heel and toe walking are normal. Tandem gait is normal.  Romberg is absent.   DIAGNOSTIC DATA (LABS, IMAGING, TESTING) - I reviewed patient records, labs, notes, testing and imaging myself where available.   ASSESSMENT AND PLAN  Veronda Daffron is a 31 y.o. female   Acute onset difficulty focusing, blurry vision   no orthostatic blood pressure changes,  Longtime type 1 diabetes  Differentiation diagnosis includes metabolic toxic, infectious  etiology, versus central nervous system etiology  MRI of the brain  Laboratory evaluations  Levert Feinstein, M.D. Ph.D.  [facility] [address] [place], [place] [address] Ph: [phone] Fax: [phone]  CC: Marie Halon, MD,Michelle Vincente Poli, MD,Bindubal Talmage Nap, MD

## 2016-11-03 ENCOUNTER — Telehealth: Payer: Self-pay | Admitting: Neurology

## 2016-11-03 DIAGNOSIS — R768 Other specified abnormal immunological findings in serum: Secondary | ICD-10-CM | POA: Diagnosis not present

## 2016-11-03 DIAGNOSIS — D8989 Other specified disorders involving the immune mechanism, not elsewhere classified: Secondary | ICD-10-CM | POA: Diagnosis not present

## 2016-11-03 LAB — CBC WITH DIFFERENTIAL
Basophils Absolute: 0.1 10*3/uL (ref 0.0–0.2)
Basos: 1 %
EOS (ABSOLUTE): 0.3 10*3/uL (ref 0.0–0.4)
EOS: 3 %
HEMATOCRIT: 39.5 % (ref 34.0–46.6)
Hemoglobin: 13.4 g/dL (ref 11.1–15.9)
Immature Grans (Abs): 0 10*3/uL (ref 0.0–0.1)
Immature Granulocytes: 0 %
LYMPHS ABS: 3.4 10*3/uL — AB (ref 0.7–3.1)
Lymphs: 33 %
MCH: 29.8 pg (ref 26.6–33.0)
MCHC: 33.9 g/dL (ref 31.5–35.7)
MCV: 88 fL (ref 79–97)
Monocytes Absolute: 0.7 10*3/uL (ref 0.1–0.9)
Monocytes: 7 %
NEUTROS ABS: 5.9 10*3/uL (ref 1.4–7.0)
Neutrophils: 56 %
RBC: 4.5 x10E6/uL (ref 3.77–5.28)
RDW: 13 % (ref 12.3–15.4)
WBC: 10.3 10*3/uL (ref 3.4–10.8)

## 2016-11-03 LAB — THYROID PANEL WITH TSH
Free Thyroxine Index: 1.4 (ref 1.2–4.9)
T3 Uptake Ratio: 25 % (ref 24–39)
T4, Total: 5.4 ug/dL (ref 4.5–12.0)
TSH: 4.84 u[IU]/mL — ABNORMAL HIGH (ref 0.450–4.500)

## 2016-11-03 LAB — URINALYSIS
Bilirubin, UA: NEGATIVE
Ketones, UA: NEGATIVE
Leukocytes, UA: NEGATIVE
Nitrite, UA: NEGATIVE
PROTEIN UA: NEGATIVE
RBC, UA: NEGATIVE
Specific Gravity, UA: 1.015 (ref 1.005–1.030)
Urobilinogen, Ur: 0.2 mg/dL (ref 0.2–1.0)
pH, UA: 6 (ref 5.0–7.5)

## 2016-11-03 LAB — COMPREHENSIVE METABOLIC PANEL
A/G RATIO: 1.4 (ref 1.2–2.2)
ALBUMIN: 4.4 g/dL (ref 3.5–5.5)
ALT: 9 IU/L (ref 0–32)
AST: 9 IU/L (ref 0–40)
Alkaline Phosphatase: 84 IU/L (ref 39–117)
BUN / CREAT RATIO: 11 (ref 9–23)
BUN: 11 mg/dL (ref 6–20)
CHLORIDE: 99 mmol/L (ref 96–106)
CO2: 24 mmol/L (ref 18–29)
CREATININE: 0.97 mg/dL (ref 0.57–1.00)
Calcium: 9.9 mg/dL (ref 8.7–10.2)
GFR calc Af Amer: 89 mL/min/{1.73_m2} (ref >59)
GFR calc non Af Amer: 78 mL/min/{1.73_m2} (ref >59)
Globulin, Total: 3.1 g/dL (ref 1.5–4.5)
Glucose: 131 mg/dL — ABNORMAL HIGH (ref 65–99)
Potassium: 4.6 mmol/L (ref 3.5–5.2)
SODIUM: 141 mmol/L (ref 134–144)
Total Protein: 7.5 g/dL (ref 6.0–8.5)

## 2016-11-03 LAB — VITAMIN B12: Vitamin B-12: 421 pg/mL (ref 232–1245)

## 2016-11-03 NOTE — Telephone Encounter (Signed)
Pt called back to [facility] to [facility], I was able to connect the call.

## 2016-11-03 NOTE — Telephone Encounter (Signed)
I spoke with the patient I told her that I had a opening this coming Wednesday 11/09/16 she stated she doesn't know if she can do that day and wants to go GI to see what they have available.. I told her to let me know if she would like to be scheduled here.

## 2016-11-09 ENCOUNTER — Ambulatory Visit (INDEPENDENT_AMBULATORY_CARE_PROVIDER_SITE_OTHER): Payer: BLUE CROSS/BLUE SHIELD

## 2016-11-09 DIAGNOSIS — E109 Type 1 diabetes mellitus without complications: Secondary | ICD-10-CM

## 2016-11-09 DIAGNOSIS — R42 Dizziness and giddiness: Secondary | ICD-10-CM | POA: Diagnosis not present

## 2016-11-11 DIAGNOSIS — E1065 Type 1 diabetes mellitus with hyperglycemia: Secondary | ICD-10-CM | POA: Diagnosis not present

## 2016-11-15 ENCOUNTER — Telehealth: Payer: Self-pay | Admitting: Neurology

## 2016-11-15 NOTE — Telephone Encounter (Signed)
Left message for a return call

## 2016-11-15 NOTE — Telephone Encounter (Signed)
Pt called for results of MRI, asking to be called once results have been read

## 2016-11-15 NOTE — Telephone Encounter (Signed)
MRI results are normal - she is aware and will keep her follow up appt to further discuss her condition.

## 2016-11-16 ENCOUNTER — Ambulatory Visit (INDEPENDENT_AMBULATORY_CARE_PROVIDER_SITE_OTHER): Payer: BLUE CROSS/BLUE SHIELD | Admitting: Neurology

## 2016-11-16 ENCOUNTER — Encounter: Payer: Self-pay | Admitting: Neurology

## 2016-11-16 DIAGNOSIS — G43709 Chronic migraine without aura, not intractable, without status migrainosus: Secondary | ICD-10-CM

## 2016-11-16 DIAGNOSIS — R109 Unspecified abdominal pain: Secondary | ICD-10-CM | POA: Diagnosis not present

## 2016-11-16 DIAGNOSIS — IMO0002 Reserved for concepts with insufficient information to code with codable children: Secondary | ICD-10-CM | POA: Insufficient documentation

## 2016-11-16 MED ORDER — SUMATRIPTAN SUCCINATE 50 MG PO TABS: 50.0000 mg | ORAL_TABLET | ORAL | 6 refills | Status: AC | PRN

## 2016-11-16 MED ORDER — TOPIRAMATE 50 MG PO TABS: 100.0000 mg | ORAL_TABLET | Freq: Two times a day (BID) | ORAL | 6 refills | Status: DC

## 2016-11-16 NOTE — Progress Notes (Signed)
PATIENT: Marie Cook DOB: 01/14/1986  Chief Complaint  Patient presents with  . Dizziness    Orthostatic Vitals: Lying: 124/84, 79, Sitting: 152/99, 83, Standing: 138/100, 93, Standing at 3 minutes: 139/106, 104 .  Reports feeling light-headed for the last 3-4 weeks.  Symptoms seem to worsen the longer she stands up but no real changes noted with sudden positional changes.  She also has pressure/pain in the back of her head, worse of right side.    Dorisann Frames, MD, endocrinologist   . Obstetrics and Gynecology    Marcelle Overlie, MD  . ENT    Drema Halon, MD - referring MD     HISTORICAL  Marie Cook  is 31 years old right-handed female, seen in refer by her ENT doctor Drema Halon for evaluation of dizziness, her primary care/gynecologist is Dr.Michelle Vincente Poli, her endocrinologist is Dr.,Bindubal Talmage Nap, initial evaluation is on November 02 2016.  She had type 1 diabetes since age 36, she was treated with insulin, had a insulin pump since 2010, overall her diabetes  is under excellent control with exception of mild elevated glucose with occasionally UTI, she also reported of previous prolonged underdiagnosed UTI, eventually was treated successfully. Hypothyroidism on supplement   She noticed gradual onset dizziness difficulty focusing since September 28 2016, she remembers she was decorating the room, noticed difficulty focusing, everything seems to be floating, symptoms has been persistent even when she was sitting down, lying down, she denies spinning sensation, she was seen by ophthalmologist initially, there was a suspicious for diabetic retinopathy, but later was refuted by second opinion from different retinal specialist  She denies double vision, noticed mild decreased acuity on the right vision, but her sensation of difficulty focusing, still present with monocular vision with either left or right eye. She denies gait abnormality,   She also developed frequent  headaches since March 2018, right occipital area, spreading forward, pressure, no significant light noise sensitivity, no nauseous  UPDATE November 16 2016: I have personally reviewed MRI of the brain without contrast on November 09 2016 that was normal. She complains of constant dizziness, with the sensation, difficulty focusing, also began to complains of right occipital area pressure headache, did help by sleep, mild nausea, noise sensitivity, no significant light sensitivity.  She will have appointment with her retinal specialist  I reviewed laboratory evaluations, normal B12, CMP showed elevated glucose 131, normal CBC, UA showed 2 plus glucose, TSH was mildly elevated 4.8, otherwise normal total T4, T3 uptake ratio  REVIEW OF SYSTEMS: Full 14 system review of systems performed and notable only for blurred vision,  ALLERGIES: Allergies  Allergen Reactions  . Oseltamivir Phosphate Rash    HOME MEDICATIONS: Current Outpatient Prescriptions  Medication Sig Dispense Refill  . ARMOUR THYROID 90 MG tablet Take 90 mg by mouth daily.  5  . insulin lispro (HUMALOG) 100 UNIT/ML injection Inject into the skin continuous. Insulin pump    . Insulin Pump Disposable (OMNIPOD) MISC 1 pod every day 90 each 3  . Vitamin D, Ergocalciferol, (DRISDOL) 50000 units CAPS capsule Take 50,000 Units by mouth every 7 (seven) days.     No current facility-administered medications for this visit.     PAST MEDICAL HISTORY: Past Medical History:  Diagnosis Date  . Abdominal pain   . Acute pyelonephritis without lesion of renal medullary necrosis   . Anxiety state, unspecified 08/26/2008  . Calculus of kidney    possible per [facility] dated 06/05/12  .  Chest pain   . DIABETES MELLITUS, TYPE I 09/1992   Type 1  . Fever   . HYPERCHOLESTEROLEMIA 08/26/2008  . HYPOKALEMIA 07/07/2009  . HYPOTHYROIDISM 08/26/2008  . MRSA infection    chronic  . Pneumonia   . Pressure in head   . Pyelonephritis, chronic     . UNSPECIFIED ANEMIA 10/05/2007    PAST SURGICAL HISTORY: Past Surgical History:  Procedure Laterality Date  . CESAREAN SECTION  2011  . CESAREAN SECTION N/A 05/30/2014   Procedure: CESAREAN SECTION;  Surgeon: Jeani Hawking, MD;  Location: [facility];  Service: Obstetrics;  Laterality: N/A;  . EDG  12/07/2006  . WISDOM TOOTH EXTRACTION  2008    FAMILY HISTORY: Family History  Problem Relation Age of Onset  . Healthy Mother   . Heart disease Father   . Colon cancer Neg Hx   . Breast cancer Neg Hx     SOCIAL HISTORY:  Social History   Social History  . Marital status: Married    Spouse name: N/A  . Number of children: 2  . Years of education: Bachelors   Occupational History  . homemaker    Social History Main Topics  . Smoking status: Never Smoker  . Smokeless tobacco: Never Used  . Alcohol use No  . Drug use: No  . Sexual activity: Not on file   Other Topics Concern  . Not on file   Social History Narrative   Lives at home with husband and two children.   Right-handed.   1 caffeine drink per week.     PHYSICAL EXAM   Vitals:   11/16/16 1557  BP: 130/80  Pulse: 77  Weight: 207 lb (93.9 kg)  Height:  (1.702 m)    Not recorded      Body mass index is 32.42 kg/m.  PHYSICAL EXAMNIATION:  Gen: NAD, conversant, well nourised, obese, well groomed                     Cardiovascular: Regular rate rhythm, no peripheral edema, warm, nontender. Eyes: Conjunctivae clear without exudates or hemorrhage Neck: Supple, no carotid bruits. Pulmonary: Clear to auscultation bilaterally   NEUROLOGICAL EXAM:  MENTAL STATUS: Speech:    Speech is normal; fluent and spontaneous with normal comprehension.  Cognition:     Orientation to time, place and person     Normal recent and remote memory     Normal Attention span and concentration     Normal Language, naming, repeating,spontaneous speech     Fund of knowledge   CRANIAL NERVES: CN II: Visual  fields are full to confrontation. Fundoscopic exam is normal with sharp discs and no vascular changes. Pupils are round equal and briskly reactive to light. CN III, IV, VI: extraocular movement are normal. No ptosis. CN V: Facial sensation is intact to pinprick in all 3 divisions bilaterally. Corneal responses are intact.  CN VII: Face is symmetric with normal eye closure and smile. CN VIII: Hearing is normal to rubbing fingers CN IX, X: Palate elevates symmetrically. Phonation is normal. CN XI: Head turning and shoulder shrug are intact CN XII: Tongue is midline with normal movements and no atrophy.  MOTOR: There is no pronator drift of out-stretched arms. Muscle bulk and tone are normal. Muscle strength is normal.  REFLEXES: Reflexes are 2+ and symmetric at the biceps, triceps, knees, and ankles. Plantar responses are flexor.  SENSORY: Intact to light touch, pinprick, positional sensation and vibratory sensation are  intact in fingers and toes.  COORDINATION: Rapid alternating movements and fine finger movements are intact. There is no dysmetria on finger-to-nose and heel-knee-shin.    GAIT/STANCE: Posture is normal. Gait is steady with normal steps, base, arm swing, and turning. Heel and toe walking are normal. Tandem gait is normal.  Romberg is absent.   DIAGNOSTIC DATA (LABS, IMAGING, TESTING) - I reviewed patient records, labs, notes, testing and imaging myself where available.   ASSESSMENT AND PLAN  Marie Cook is a 31 y.o. female   One-month history of dizziness, lateralized headaches  Differentiation diagnosis including migraine variants  Normal MRI of brain  Topamax 50 mg titrating to 2 tablets twice a day  Imitrex as needed  Levert Feinstein, M.D. Ph.D.  [facility] [address] [place], [place] [address] Ph: [phone] Fax: [phone]  CC: Drema Halon, MD,Michelle Vincente Poli, MD,Bindubal Talmage Nap, MD

## 2016-11-16 NOTE — Telephone Encounter (Signed)
Spoke to patient - there were no available appts this afternoon.  She will keep her pending follow up on 12/01/16.

## 2016-11-16 NOTE — Telephone Encounter (Signed)
Pt wants Marie Cook to know that she is here in [place] and wanted to know (based upon pt letting Marie Cook know that she lives an hour away) if there are any time slots avail this afternoon for her to be seen while in town. She said she will wait around in town until she hears from [place]

## 2016-11-17 DIAGNOSIS — N3289 Other specified disorders of bladder: Secondary | ICD-10-CM | POA: Diagnosis not present

## 2016-11-17 DIAGNOSIS — R1030 Lower abdominal pain, unspecified: Secondary | ICD-10-CM | POA: Diagnosis not present

## 2016-11-17 DIAGNOSIS — N281 Cyst of kidney, acquired: Secondary | ICD-10-CM | POA: Diagnosis not present

## 2016-11-17 DIAGNOSIS — R109 Unspecified abdominal pain: Secondary | ICD-10-CM | POA: Diagnosis not present

## 2016-11-21 DIAGNOSIS — H4311 Vitreous hemorrhage, right eye: Secondary | ICD-10-CM | POA: Diagnosis not present

## 2016-11-21 DIAGNOSIS — H3562 Retinal hemorrhage, left eye: Secondary | ICD-10-CM | POA: Diagnosis not present

## 2016-11-21 DIAGNOSIS — E103592 Type 1 diabetes mellitus with proliferative diabetic retinopathy without macular edema, left eye: Secondary | ICD-10-CM | POA: Diagnosis not present

## 2016-11-21 DIAGNOSIS — E103531 Type 1 diabetes mellitus with proliferative diabetic retinopathy with traction retinal detachment not involving the macula, right eye: Secondary | ICD-10-CM | POA: Diagnosis not present

## 2016-11-29 ENCOUNTER — Other Ambulatory Visit: Payer: Self-pay | Admitting: Ophthalmology

## 2016-11-29 DIAGNOSIS — H469 Unspecified optic neuritis: Secondary | ICD-10-CM

## 2016-12-01 ENCOUNTER — Ambulatory Visit: Payer: BLUE CROSS/BLUE SHIELD | Admitting: Neurology

## 2016-12-07 DIAGNOSIS — E1065 Type 1 diabetes mellitus with hyperglycemia: Secondary | ICD-10-CM | POA: Diagnosis not present

## 2016-12-08 ENCOUNTER — Other Ambulatory Visit: Payer: Self-pay | Admitting: Ophthalmology

## 2016-12-08 DIAGNOSIS — E039 Hypothyroidism, unspecified: Secondary | ICD-10-CM | POA: Diagnosis not present

## 2016-12-08 DIAGNOSIS — H469 Unspecified optic neuritis: Secondary | ICD-10-CM

## 2016-12-14 ENCOUNTER — Ambulatory Visit
Admission: RE | Admit: 2016-12-14 | Discharge: 2016-12-14 | Disposition: A | Discharge status: Home / Self Care | Payer: BLUE CROSS/BLUE SHIELD | Source: Ambulatory Visit | Attending: Ophthalmology | Admitting: Ophthalmology

## 2016-12-14 DIAGNOSIS — H469 Unspecified optic neuritis: Secondary | ICD-10-CM

## 2016-12-14 DIAGNOSIS — H5461 Unqualified visual loss, right eye, normal vision left eye: Secondary | ICD-10-CM | POA: Diagnosis not present

## 2016-12-14 MED ORDER — GADOBENATE DIMEGLUMINE 529 MG/ML IV SOLN
20.0000 mL | Freq: Once | INTRAVENOUS | Status: AC | PRN
  Administered 2016-12-14: 19 mL via INTRAVENOUS

## 2016-12-22 ENCOUNTER — Ambulatory Visit (INDEPENDENT_AMBULATORY_CARE_PROVIDER_SITE_OTHER): Payer: BLUE CROSS/BLUE SHIELD | Admitting: Neurology

## 2016-12-22 ENCOUNTER — Encounter: Payer: Self-pay | Admitting: Neurology

## 2016-12-22 VITALS — BP 119/79 | HR 86 | Ht 67.0 in | Wt 207.5 lb

## 2016-12-22 DIAGNOSIS — R42 Dizziness and giddiness: Secondary | ICD-10-CM | POA: Diagnosis not present

## 2016-12-22 DIAGNOSIS — E103592 Type 1 diabetes mellitus with proliferative diabetic retinopathy without macular edema, left eye: Secondary | ICD-10-CM | POA: Diagnosis not present

## 2016-12-22 DIAGNOSIS — Z9641 Presence of insulin pump (external) (internal): Secondary | ICD-10-CM | POA: Diagnosis not present

## 2016-12-22 DIAGNOSIS — E109 Type 1 diabetes mellitus without complications: Secondary | ICD-10-CM | POA: Diagnosis not present

## 2016-12-22 DIAGNOSIS — H3582 Retinal ischemia: Secondary | ICD-10-CM | POA: Diagnosis not present

## 2016-12-22 DIAGNOSIS — E039 Hypothyroidism, unspecified: Secondary | ICD-10-CM | POA: Diagnosis not present

## 2016-12-22 DIAGNOSIS — G43709 Chronic migraine without aura, not intractable, without status migrainosus: Secondary | ICD-10-CM | POA: Diagnosis not present

## 2016-12-22 DIAGNOSIS — E103531 Type 1 diabetes mellitus with proliferative diabetic retinopathy with traction retinal detachment not involving the macula, right eye: Secondary | ICD-10-CM | POA: Diagnosis not present

## 2016-12-22 DIAGNOSIS — IMO0002 Reserved for concepts with insufficient information to code with codable children: Secondary | ICD-10-CM

## 2016-12-22 DIAGNOSIS — H4311 Vitreous hemorrhage, right eye: Secondary | ICD-10-CM | POA: Diagnosis not present

## 2016-12-22 MED ORDER — VENLAFAXINE HCL ER 37.5 MG PO CP24
75.0000 mg | ORAL_CAPSULE | Freq: Every day | ORAL | 11 refills | Status: AC
Start: 2016-12-22 — End: ?

## 2016-12-22 NOTE — Progress Notes (Signed)
PATIENT: Marie Cook DOB: [birth year]  Chief Complaint  Patient presents with  . Dizziness    Orthostatic Vitals: Lying: 124/84, 79, Sitting: 152/99, 83, Standing: 138/100, 93, Standing at 3 minutes: 139/106, 104 .  Reports feeling light-headed for the last 3-4 weeks.  Symptoms seem to worsen the longer she stands up but no real changes noted with sudden positional changes.  She also has pressure/pain in the back of her head, worse of right side.    Marie Pi, MD, endocrinologist   . Obstetrics and Gynecology    Marie Queen, MD  . ENT    Marie Nunnery, MD - referring MD     HISTORICAL  Marie Cook  is 31 years old right-handed female, seen in refer by her ENT doctor Marie Cook for evaluation of dizziness, her primary care/gynecologist is Marie Cook, her endocrinologist is Marie Cook, initial evaluation is on November 02 2016.  She had type 1 diabetes since age 44, she was treated with insulin, had a insulin pump since 2010, overall her diabetes  is under excellent control with exception of mild elevated glucose with occasionally UTI, she also reported of previous prolonged underdiagnosed UTI, eventually was treated successfully. Hypothyroidism on supplement   She noticed gradual onset dizziness difficulty focusing since September 28 2016, she remembers she was decorating the room, noticed difficulty focusing, everything seems to be floating, symptoms has been persistent even when she was sitting down, lying down, she denies spinning sensation, she was seen by ophthalmologist initially, there was a suspicious for diabetic retinopathy, but later was refuted by second opinion from different retinal specialist  She denies double vision, noticed mild decreased acuity on the right vision, but her sensation of difficulty focusing, still present with monocular vision with either left or right eye. She denies gait abnormality,   She also developed frequent  headaches since March 2018, right occipital area, spreading forward, pressure, no significant light noise sensitivity, no nauseous  UPDATE November 16 2016: I have personally reviewed MRI of the brain without contrast on November 09 2016 that was normal. She complains of constant dizziness sensation, difficulty focusing, also began to complains of right occipital area pressure headache, did help by sleep, mild nausea, noise sensitivity, no significant light sensitivity.  She will have appointment with her retinal specialist  I reviewed laboratory evaluations, normal B12, CMP showed elevated glucose 131, normal CBC, UA showed 2 plus glucose, TSH was mildly elevated 4.8, otherwise normal total T4, T3 uptake ratio  UPDATE Dec 22 2016: I was able to review her note, she continue to complain  film over her eyes, extreme headache, blurry vision, fatigue, decreased functional level,  I also reviewed laboratory results on October 11 2016, hemoglobin was 13 point 6, creatinine was 1.85, glucose was significantly elevated at 348, A1c was 9.0, mild elevated triglyceride 2008, LDL was 42 vitamin B12 was within low normal range 370, vitamin D was decreased 21, positive ANA, 1:40 speckled pattern, laboratory evaluation on November 04 2016, more calm pretenses autoimmune test showed negative anti-double-stranded DNA, anti-Smith IgG antibody, CBC Antibody, criteria for tear 1 positive was not met, this assessment is associated with a low likelihood of lupus, but she does have positive antithyroglobulin IgG antibody,271, antithyroid peroxidase antibody 988, will be followed by her endocrinologist Dr. Chalmers Cook  Have personally reviewed MRI of the brain without contrast in March 2018 that was normal, she also had a repeat MRI of the brain and orbits with and without contrast  on Dec 14 2016 that was normal as well   REVIEW OF SYSTEMS: Full 14 system review of systems performed and notable only for blurred vision,  ALLERGIES: No  Active Allergies  HOME MEDICATIONS: Current Outpatient Prescriptions  Medication Sig Dispense Refill  . ARMOUR THYROID 90 MG tablet Take 90 mg by mouth daily.  5  . insulin lispro (HUMALOG) 100 UNIT/ML injection Inject into the skin continuous. Insulin pump    . Insulin Pump Disposable (OMNIPOD) MISC 1 pod every day 90 each 3  . SUMAtriptan (IMITREX) 50 MG tablet Take 1 tablet (50 mg total) by mouth every 2 (two) hours as needed for migraine. May repeat in 2 hours if headache persists or recurs. 15 tablet 6  . topiramate (TOPAMAX) 50 MG tablet Take 2 tablets (100 mg total) by mouth 2 (two) times daily. 120 tablet 6  . Vitamin D, Ergocalciferol, (DRISDOL) 50000 units CAPS capsule Take 50,000 Units by mouth every 7 (seven) days.     No current facility-administered medications for this visit.     PAST MEDICAL HISTORY: Past Medical History:  Diagnosis Date  . Abdominal pain   . Acute pyelonephritis without lesion of renal medullary necrosis   . Anxiety state, unspecified 08/26/2008  . Calculus of kidney    possible per [facility] dated 06/05/12  . Chest pain   . DIABETES MELLITUS, TYPE I 09/1992   Type 1  . Fever   . HYPERCHOLESTEROLEMIA 08/26/2008  . HYPOKALEMIA 07/07/2009  . HYPOTHYROIDISM 08/26/2008  . MRSA infection    chronic  . Pneumonia   . Pressure in head   . Pyelonephritis, chronic   . UNSPECIFIED ANEMIA 10/05/2007    PAST SURGICAL HISTORY: Past Surgical History:  Procedure Laterality Date  . CESAREAN SECTION  2011  . CESAREAN SECTION N/A 05/30/2014   Procedure: CESAREAN SECTION;  Surgeon: Marie Mourning, MD;  Location: [facility];  Service: Obstetrics;  Laterality: N/A;  . EDG  12/07/2006  . WISDOM TOOTH EXTRACTION  2008    FAMILY HISTORY: Family History  Problem Relation Age of Onset  . Healthy Mother   . Heart disease Father   . Colon cancer Neg Hx   . Breast cancer Neg Hx     SOCIAL HISTORY:  Social History   Social History  . Marital status:  Married    Spouse name: N/A  . Number of children: 2  . Years of education: Bachelors   Occupational History  . homemaker    Social History Main Topics  . Smoking status: Never Smoker  . Smokeless tobacco: Never Used  . Alcohol use No  . Drug use: No  . Sexual activity: Not on file   Other Topics Concern  . Not on file   Social History Narrative   Lives at home with husband and two children.   Right-handed.   1 caffeine drink per week.     PHYSICAL EXAM   Vitals:   12/22/16 0932  BP: 119/79  Pulse: 86  Weight: 207 lb 8 oz (94.1 kg)  Height: 5' 7"  (1.702 m)    Not recorded      Body mass index is 32.5 kg/m.  PHYSICAL EXAMNIATION:  Gen: NAD, conversant, well nourised, obese, well groomed                     Cardiovascular: Regular rate rhythm, no peripheral edema, warm, nontender. Eyes: Conjunctivae clear without exudates or hemorrhage Neck: Supple, no carotid bruits. Pulmonary:  Clear to auscultation bilaterally   NEUROLOGICAL EXAM:  MENTAL STATUS: Speech:    Speech is normal; fluent and spontaneous with normal comprehension.  Cognition:     Orientation to time, place and person     Normal recent and remote memory     Normal Attention span and concentration     Normal Language, naming, repeating,spontaneous speech     Fund of knowledge   CRANIAL NERVES: CN II: Visual fields are full to confrontation. Fundoscopic exam is normal with sharp discs and no vascular changes. Pupils are round equal and briskly reactive to light. CN III, IV, VI: extraocular movement are normal. No ptosis. CN V: Facial sensation is intact to pinprick in all 3 divisions bilaterally. Corneal responses are intact.  CN VII: Face is symmetric with normal eye closure and smile. CN VIII: Hearing is normal to rubbing fingers CN IX, X: Palate elevates symmetrically. Phonation is normal. CN XI: Head turning and shoulder shrug are intact CN XII: Tongue is midline with normal movements  and no atrophy.  MOTOR: There is no pronator drift of out-stretched arms. Muscle bulk and tone are normal. Muscle strength is normal.  REFLEXES: Reflexes are 2+ and symmetric at the biceps, triceps, knees, and ankles. Plantar responses are flexor.  SENSORY: Intact to light touch, pinprick, positional sensation and vibratory sensation are intact in fingers and toes.  COORDINATION: Rapid alternating movements and fine finger movements are intact. There is no dysmetria on finger-to-nose and heel-knee-shin.    GAIT/STANCE: Posture is normal. Gait is steady with normal steps, base, arm swing, and turning. Heel and toe walking are normal. Tandem gait is normal.  Romberg is absent.   DIAGNOSTIC DATA (LABS, IMAGING, TESTING) - I reviewed patient records, labs, notes, testing and imaging myself where available.   ASSESSMENT AND PLAN  Marie Cook is a 31 y.o. female   2 history of dizziness, lateralized headaches  Differentiation diagnosis including migraine variants versus anxiety related symptoms  Normal MRI of brain  Continue Topamax 50 mg titrating to 2 tablets twice a day  Imitrex as needed  Marie Cook, M.D. Ph.D.  [facility] [address], [place], [place] [address] Ph: [phone] Fax: [phone]  CC: Marie Nunnery, MD,Michelle Helane Rima, MD,Bindubal Chalmers Cater, MD

## 2016-12-26 ENCOUNTER — Telehealth: Payer: Self-pay | Admitting: Neurology

## 2016-12-26 NOTE — Telephone Encounter (Signed)
I reviewed the laboratory evaluation from her primary care dated October 11 2016 Graft normal CBC hemoglobin of 13 point 6, CMP, with sodium of 133, glucose of 348, cholesterol 152, LDL 80, ferritin was low 27, B12 was 370, A1c was 9.0, estimated glucose 212, positive ANA, with titer of 1:40, vitamin D was 21

## 2016-12-29 DIAGNOSIS — H4311 Vitreous hemorrhage, right eye: Secondary | ICD-10-CM | POA: Diagnosis not present

## 2016-12-29 DIAGNOSIS — E103531 Type 1 diabetes mellitus with proliferative diabetic retinopathy with traction retinal detachment not involving the macula, right eye: Secondary | ICD-10-CM | POA: Diagnosis not present

## 2016-12-29 DIAGNOSIS — H3582 Retinal ischemia: Secondary | ICD-10-CM | POA: Diagnosis not present

## 2016-12-29 DIAGNOSIS — E103592 Type 1 diabetes mellitus with proliferative diabetic retinopathy without macular edema, left eye: Secondary | ICD-10-CM | POA: Diagnosis not present

## 2017-01-05 DIAGNOSIS — E103531 Type 1 diabetes mellitus with proliferative diabetic retinopathy with traction retinal detachment not involving the macula, right eye: Secondary | ICD-10-CM | POA: Diagnosis not present

## 2017-01-07 DIAGNOSIS — J019 Acute sinusitis, unspecified: Secondary | ICD-10-CM | POA: Diagnosis not present

## 2017-01-07 DIAGNOSIS — E109 Type 1 diabetes mellitus without complications: Secondary | ICD-10-CM | POA: Diagnosis not present

## 2017-01-11 DIAGNOSIS — H30003 Unspecified focal chorioretinal inflammation, bilateral: Secondary | ICD-10-CM | POA: Diagnosis not present

## 2017-01-11 DIAGNOSIS — H4311 Vitreous hemorrhage, right eye: Secondary | ICD-10-CM | POA: Diagnosis not present

## 2017-01-11 DIAGNOSIS — Z79899 Other long term (current) drug therapy: Secondary | ICD-10-CM | POA: Diagnosis not present

## 2017-01-11 DIAGNOSIS — E103593 Type 1 diabetes mellitus with proliferative diabetic retinopathy without macular edema, bilateral: Secondary | ICD-10-CM | POA: Diagnosis not present

## 2017-01-11 DIAGNOSIS — E063 Autoimmune thyroiditis: Secondary | ICD-10-CM | POA: Diagnosis not present

## 2017-01-11 DIAGNOSIS — R5383 Other fatigue: Secondary | ICD-10-CM | POA: Diagnosis not present

## 2017-01-11 DIAGNOSIS — H5440 Blindness, one eye, unspecified eye: Secondary | ICD-10-CM | POA: Diagnosis not present

## 2017-01-11 DIAGNOSIS — E039 Hypothyroidism, unspecified: Secondary | ICD-10-CM | POA: Diagnosis not present

## 2017-01-11 DIAGNOSIS — E1065 Type 1 diabetes mellitus with hyperglycemia: Secondary | ICD-10-CM | POA: Diagnosis not present

## 2017-01-11 DIAGNOSIS — Z8639 Personal history of other endocrine, nutritional and metabolic disease: Secondary | ICD-10-CM | POA: Diagnosis not present

## 2017-01-11 DIAGNOSIS — E109 Type 1 diabetes mellitus without complications: Secondary | ICD-10-CM | POA: Diagnosis not present

## 2017-01-11 DIAGNOSIS — Z888 Allergy status to other drugs, medicaments and biological substances status: Secondary | ICD-10-CM | POA: Diagnosis not present

## 2017-01-12 DIAGNOSIS — H4311 Vitreous hemorrhage, right eye: Secondary | ICD-10-CM | POA: Diagnosis not present

## 2017-01-12 DIAGNOSIS — E103512 Type 1 diabetes mellitus with proliferative diabetic retinopathy with macular edema, left eye: Secondary | ICD-10-CM | POA: Diagnosis not present

## 2017-01-12 DIAGNOSIS — H3561 Retinal hemorrhage, right eye: Secondary | ICD-10-CM | POA: Diagnosis not present

## 2017-01-19 DIAGNOSIS — H3561 Retinal hemorrhage, right eye: Secondary | ICD-10-CM | POA: Diagnosis not present

## 2017-01-19 DIAGNOSIS — H4311 Vitreous hemorrhage, right eye: Secondary | ICD-10-CM | POA: Diagnosis not present

## 2017-01-24 DIAGNOSIS — E103593 Type 1 diabetes mellitus with proliferative diabetic retinopathy without macular edema, bilateral: Secondary | ICD-10-CM | POA: Diagnosis not present

## 2017-01-24 DIAGNOSIS — H4311 Vitreous hemorrhage, right eye: Secondary | ICD-10-CM | POA: Diagnosis not present

## 2017-01-24 DIAGNOSIS — H3341 Traction detachment of retina, right eye: Secondary | ICD-10-CM | POA: Diagnosis not present

## 2017-01-24 DIAGNOSIS — I6529 Occlusion and stenosis of unspecified carotid artery: Secondary | ICD-10-CM | POA: Diagnosis not present

## 2017-01-24 DIAGNOSIS — H30003 Unspecified focal chorioretinal inflammation, bilateral: Secondary | ICD-10-CM | POA: Diagnosis not present

## 2017-02-03 DIAGNOSIS — E039 Hypothyroidism, unspecified: Secondary | ICD-10-CM | POA: Diagnosis not present

## 2017-02-06 DIAGNOSIS — H33051 Total retinal detachment, right eye: Secondary | ICD-10-CM | POA: Diagnosis not present

## 2017-02-06 DIAGNOSIS — H4311 Vitreous hemorrhage, right eye: Secondary | ICD-10-CM | POA: Diagnosis not present

## 2017-02-06 DIAGNOSIS — H33021 Retinal detachment with multiple breaks, right eye: Secondary | ICD-10-CM | POA: Diagnosis not present

## 2017-02-06 DIAGNOSIS — E113591 Type 2 diabetes mellitus with proliferative diabetic retinopathy without macular edema, right eye: Secondary | ICD-10-CM | POA: Diagnosis not present

## 2017-02-06 DIAGNOSIS — Z01818 Encounter for other preprocedural examination: Secondary | ICD-10-CM | POA: Diagnosis not present

## 2017-02-06 DIAGNOSIS — E119 Type 2 diabetes mellitus without complications: Secondary | ICD-10-CM | POA: Diagnosis not present

## 2017-02-13 DIAGNOSIS — E1065 Type 1 diabetes mellitus with hyperglycemia: Secondary | ICD-10-CM | POA: Diagnosis not present

## 2017-02-14 DIAGNOSIS — Z794 Long term (current) use of insulin: Secondary | ICD-10-CM | POA: Diagnosis not present

## 2017-02-14 DIAGNOSIS — E1065 Type 1 diabetes mellitus with hyperglycemia: Secondary | ICD-10-CM | POA: Diagnosis not present

## 2017-02-14 DIAGNOSIS — E109 Type 1 diabetes mellitus without complications: Secondary | ICD-10-CM | POA: Diagnosis not present

## 2017-02-20 DIAGNOSIS — H578 Other specified disorders of eye and adnexa: Secondary | ICD-10-CM | POA: Diagnosis not present

## 2017-02-20 DIAGNOSIS — E113591 Type 2 diabetes mellitus with proliferative diabetic retinopathy without macular edema, right eye: Secondary | ICD-10-CM | POA: Diagnosis not present

## 2017-02-20 DIAGNOSIS — H3341 Traction detachment of retina, right eye: Secondary | ICD-10-CM | POA: Diagnosis not present

## 2017-02-21 DIAGNOSIS — E113512 Type 2 diabetes mellitus with proliferative diabetic retinopathy with macular edema, left eye: Secondary | ICD-10-CM | POA: Diagnosis not present

## 2017-03-15 DIAGNOSIS — E109 Type 1 diabetes mellitus without complications: Secondary | ICD-10-CM | POA: Diagnosis not present

## 2017-03-15 DIAGNOSIS — Z9641 Presence of insulin pump (external) (internal): Secondary | ICD-10-CM | POA: Diagnosis not present

## 2017-03-15 DIAGNOSIS — E039 Hypothyroidism, unspecified: Secondary | ICD-10-CM | POA: Diagnosis not present

## 2017-03-16 DIAGNOSIS — E113512 Type 2 diabetes mellitus with proliferative diabetic retinopathy with macular edema, left eye: Secondary | ICD-10-CM | POA: Diagnosis not present

## 2017-03-17 DIAGNOSIS — H5213 Myopia, bilateral: Secondary | ICD-10-CM | POA: Diagnosis not present

## 2017-03-17 DIAGNOSIS — E103512 Type 1 diabetes mellitus with proliferative diabetic retinopathy with macular edema, left eye: Secondary | ICD-10-CM | POA: Diagnosis not present

## 2017-04-11 ENCOUNTER — Telehealth: Payer: Self-pay | Admitting: *Deleted

## 2017-04-11 ENCOUNTER — Ambulatory Visit: Payer: BLUE CROSS/BLUE SHIELD | Admitting: Neurology

## 2017-04-11 NOTE — Telephone Encounter (Signed)
No showed follow up appointment. 

## 2017-04-12 ENCOUNTER — Encounter: Payer: Self-pay | Admitting: Neurology

## 2017-04-18 DIAGNOSIS — H4312 Vitreous hemorrhage, left eye: Secondary | ICD-10-CM | POA: Diagnosis not present

## 2017-04-28 DIAGNOSIS — E109 Type 1 diabetes mellitus without complications: Secondary | ICD-10-CM | POA: Diagnosis not present

## 2017-04-28 DIAGNOSIS — Z794 Long term (current) use of insulin: Secondary | ICD-10-CM | POA: Diagnosis not present

## 2017-05-02 DIAGNOSIS — E103512 Type 1 diabetes mellitus with proliferative diabetic retinopathy with macular edema, left eye: Secondary | ICD-10-CM | POA: Diagnosis not present

## 2017-05-16 DIAGNOSIS — E039 Hypothyroidism, unspecified: Secondary | ICD-10-CM | POA: Diagnosis not present

## 2017-05-24 DIAGNOSIS — Z8669 Personal history of other diseases of the nervous system and sense organs: Secondary | ICD-10-CM | POA: Diagnosis not present

## 2017-05-24 DIAGNOSIS — E11319 Type 2 diabetes mellitus with unspecified diabetic retinopathy without macular edema: Secondary | ICD-10-CM | POA: Diagnosis not present

## 2017-05-26 DIAGNOSIS — Z794 Long term (current) use of insulin: Secondary | ICD-10-CM | POA: Diagnosis not present

## 2017-05-26 DIAGNOSIS — E109 Type 1 diabetes mellitus without complications: Secondary | ICD-10-CM | POA: Diagnosis not present

## 2017-05-26 DIAGNOSIS — E1065 Type 1 diabetes mellitus with hyperglycemia: Secondary | ICD-10-CM | POA: Diagnosis not present

## 2017-05-29 DIAGNOSIS — H33021 Retinal detachment with multiple breaks, right eye: Secondary | ICD-10-CM | POA: Diagnosis not present

## 2017-06-12 DIAGNOSIS — S058X1A Other injuries of right eye and orbit, initial encounter: Secondary | ICD-10-CM | POA: Diagnosis not present

## 2017-06-19 DIAGNOSIS — E1065 Type 1 diabetes mellitus with hyperglycemia: Secondary | ICD-10-CM | POA: Diagnosis not present

## 2017-06-19 DIAGNOSIS — Z794 Long term (current) use of insulin: Secondary | ICD-10-CM | POA: Diagnosis not present

## 2017-06-19 DIAGNOSIS — E109 Type 1 diabetes mellitus without complications: Secondary | ICD-10-CM | POA: Diagnosis not present

## 2017-06-20 DIAGNOSIS — E103512 Type 1 diabetes mellitus with proliferative diabetic retinopathy with macular edema, left eye: Secondary | ICD-10-CM | POA: Diagnosis not present

## 2017-06-28 DIAGNOSIS — E103553 Type 1 diabetes mellitus with stable proliferative diabetic retinopathy, bilateral: Secondary | ICD-10-CM | POA: Diagnosis not present

## 2017-06-28 DIAGNOSIS — H3581 Retinal edema: Secondary | ICD-10-CM | POA: Diagnosis not present

## 2017-06-28 DIAGNOSIS — H43813 Vitreous degeneration, bilateral: Secondary | ICD-10-CM | POA: Diagnosis not present

## 2017-06-29 DIAGNOSIS — E039 Hypothyroidism, unspecified: Secondary | ICD-10-CM | POA: Diagnosis not present

## 2017-06-29 DIAGNOSIS — H5461 Unqualified visual loss, right eye, normal vision left eye: Secondary | ICD-10-CM | POA: Diagnosis not present

## 2017-06-29 DIAGNOSIS — R768 Other specified abnormal immunological findings in serum: Secondary | ICD-10-CM | POA: Diagnosis not present

## 2017-06-29 DIAGNOSIS — E103599 Type 1 diabetes mellitus with proliferative diabetic retinopathy without macular edema, unspecified eye: Secondary | ICD-10-CM | POA: Diagnosis not present

## 2017-06-30 DIAGNOSIS — E103512 Type 1 diabetes mellitus with proliferative diabetic retinopathy with macular edema, left eye: Secondary | ICD-10-CM | POA: Diagnosis not present

## 2017-07-18 DIAGNOSIS — Z8669 Personal history of other diseases of the nervous system and sense organs: Secondary | ICD-10-CM | POA: Diagnosis not present

## 2017-07-18 DIAGNOSIS — H3341 Traction detachment of retina, right eye: Secondary | ICD-10-CM | POA: Diagnosis not present

## 2017-07-18 DIAGNOSIS — Z48810 Encounter for surgical aftercare following surgery on the sense organs: Secondary | ICD-10-CM | POA: Diagnosis not present

## 2017-07-18 DIAGNOSIS — H5789 Other specified disorders of eye and adnexa: Secondary | ICD-10-CM | POA: Diagnosis not present

## 2017-07-19 DIAGNOSIS — Z794 Long term (current) use of insulin: Secondary | ICD-10-CM | POA: Diagnosis not present

## 2017-07-19 DIAGNOSIS — E109 Type 1 diabetes mellitus without complications: Secondary | ICD-10-CM | POA: Diagnosis not present

## 2017-08-03 DIAGNOSIS — H3341 Traction detachment of retina, right eye: Secondary | ICD-10-CM | POA: Diagnosis not present

## 2017-08-10 DIAGNOSIS — E103411 Type 1 diabetes mellitus with severe nonproliferative diabetic retinopathy with macular edema, right eye: Secondary | ICD-10-CM | POA: Diagnosis not present

## 2017-08-11 DIAGNOSIS — L720 Epidermal cyst: Secondary | ICD-10-CM | POA: Diagnosis not present

## 2017-08-11 DIAGNOSIS — L0201 Cutaneous abscess of face: Secondary | ICD-10-CM | POA: Diagnosis not present

## 2017-08-11 DIAGNOSIS — L0100 Impetigo, unspecified: Secondary | ICD-10-CM | POA: Diagnosis not present

## 2017-09-04 DIAGNOSIS — Z794 Long term (current) use of insulin: Secondary | ICD-10-CM | POA: Diagnosis not present

## 2017-09-04 DIAGNOSIS — E109 Type 1 diabetes mellitus without complications: Secondary | ICD-10-CM | POA: Diagnosis not present

## 2017-09-04 DIAGNOSIS — E1065 Type 1 diabetes mellitus with hyperglycemia: Secondary | ICD-10-CM | POA: Diagnosis not present

## 2017-09-05 DIAGNOSIS — E103411 Type 1 diabetes mellitus with severe nonproliferative diabetic retinopathy with macular edema, right eye: Secondary | ICD-10-CM | POA: Diagnosis not present

## 2017-09-18 DIAGNOSIS — Z9641 Presence of insulin pump (external) (internal): Secondary | ICD-10-CM | POA: Diagnosis not present

## 2017-09-18 DIAGNOSIS — E039 Hypothyroidism, unspecified: Secondary | ICD-10-CM | POA: Diagnosis not present

## 2017-09-18 DIAGNOSIS — E109 Type 1 diabetes mellitus without complications: Secondary | ICD-10-CM | POA: Diagnosis not present

## 2017-09-18 DIAGNOSIS — E11319 Type 2 diabetes mellitus with unspecified diabetic retinopathy without macular edema: Secondary | ICD-10-CM | POA: Diagnosis not present

## 2017-10-19 DIAGNOSIS — E109 Type 1 diabetes mellitus without complications: Secondary | ICD-10-CM | POA: Diagnosis not present

## 2017-10-19 DIAGNOSIS — Z794 Long term (current) use of insulin: Secondary | ICD-10-CM | POA: Diagnosis not present

## 2017-10-19 DIAGNOSIS — E1065 Type 1 diabetes mellitus with hyperglycemia: Secondary | ICD-10-CM | POA: Diagnosis not present

## 2017-11-16 DIAGNOSIS — E039 Hypothyroidism, unspecified: Secondary | ICD-10-CM | POA: Diagnosis not present

## 2017-11-20 DIAGNOSIS — E103553 Type 1 diabetes mellitus with stable proliferative diabetic retinopathy, bilateral: Secondary | ICD-10-CM | POA: Diagnosis not present

## 2017-11-23 DIAGNOSIS — E109 Type 1 diabetes mellitus without complications: Secondary | ICD-10-CM | POA: Diagnosis not present

## 2017-11-23 DIAGNOSIS — E1065 Type 1 diabetes mellitus with hyperglycemia: Secondary | ICD-10-CM | POA: Diagnosis not present

## 2017-11-23 DIAGNOSIS — Z794 Long term (current) use of insulin: Secondary | ICD-10-CM | POA: Diagnosis not present

## 2017-12-06 DIAGNOSIS — E1065 Type 1 diabetes mellitus with hyperglycemia: Secondary | ICD-10-CM | POA: Diagnosis not present

## 2017-12-06 DIAGNOSIS — E109 Type 1 diabetes mellitus without complications: Secondary | ICD-10-CM | POA: Diagnosis not present

## 2017-12-06 DIAGNOSIS — Z794 Long term (current) use of insulin: Secondary | ICD-10-CM | POA: Diagnosis not present

## 2018-01-24 DIAGNOSIS — E1065 Type 1 diabetes mellitus with hyperglycemia: Secondary | ICD-10-CM | POA: Diagnosis not present

## 2018-01-24 DIAGNOSIS — Z794 Long term (current) use of insulin: Secondary | ICD-10-CM | POA: Diagnosis not present

## 2018-01-24 DIAGNOSIS — E109 Type 1 diabetes mellitus without complications: Secondary | ICD-10-CM | POA: Diagnosis not present

## 2018-01-25 DIAGNOSIS — E039 Hypothyroidism, unspecified: Secondary | ICD-10-CM | POA: Diagnosis not present

## 2018-01-25 DIAGNOSIS — J020 Streptococcal pharyngitis: Secondary | ICD-10-CM | POA: Diagnosis not present

## 2018-02-19 DIAGNOSIS — E103593 Type 1 diabetes mellitus with proliferative diabetic retinopathy without macular edema, bilateral: Secondary | ICD-10-CM | POA: Diagnosis not present

## 2018-02-19 DIAGNOSIS — E103553 Type 1 diabetes mellitus with stable proliferative diabetic retinopathy, bilateral: Secondary | ICD-10-CM | POA: Diagnosis not present

## 2018-02-20 DIAGNOSIS — E109 Type 1 diabetes mellitus without complications: Secondary | ICD-10-CM | POA: Diagnosis not present

## 2018-02-20 DIAGNOSIS — Z794 Long term (current) use of insulin: Secondary | ICD-10-CM | POA: Diagnosis not present

## 2018-02-23 DIAGNOSIS — E1065 Type 1 diabetes mellitus with hyperglycemia: Secondary | ICD-10-CM | POA: Diagnosis not present

## 2018-02-23 DIAGNOSIS — Z794 Long term (current) use of insulin: Secondary | ICD-10-CM | POA: Diagnosis not present

## 2018-02-23 DIAGNOSIS — E109 Type 1 diabetes mellitus without complications: Secondary | ICD-10-CM | POA: Diagnosis not present

## 2018-03-05 DIAGNOSIS — E109 Type 1 diabetes mellitus without complications: Secondary | ICD-10-CM | POA: Diagnosis not present

## 2018-03-05 DIAGNOSIS — Z794 Long term (current) use of insulin: Secondary | ICD-10-CM | POA: Diagnosis not present

## 2018-03-05 DIAGNOSIS — E1065 Type 1 diabetes mellitus with hyperglycemia: Secondary | ICD-10-CM | POA: Diagnosis not present

## 2018-03-19 DIAGNOSIS — E109 Type 1 diabetes mellitus without complications: Secondary | ICD-10-CM | POA: Diagnosis not present

## 2018-03-19 DIAGNOSIS — E611 Iron deficiency: Secondary | ICD-10-CM | POA: Diagnosis not present

## 2018-03-19 DIAGNOSIS — E538 Deficiency of other specified B group vitamins: Secondary | ICD-10-CM | POA: Diagnosis not present

## 2018-03-19 DIAGNOSIS — E559 Vitamin D deficiency, unspecified: Secondary | ICD-10-CM | POA: Diagnosis not present

## 2018-03-19 DIAGNOSIS — E039 Hypothyroidism, unspecified: Secondary | ICD-10-CM | POA: Diagnosis not present

## 2018-03-19 DIAGNOSIS — Z9641 Presence of insulin pump (external) (internal): Secondary | ICD-10-CM | POA: Diagnosis not present

## 2018-04-20 DIAGNOSIS — E109 Type 1 diabetes mellitus without complications: Secondary | ICD-10-CM | POA: Diagnosis not present

## 2018-04-20 DIAGNOSIS — E1065 Type 1 diabetes mellitus with hyperglycemia: Secondary | ICD-10-CM | POA: Diagnosis not present

## 2018-04-20 DIAGNOSIS — Z794 Long term (current) use of insulin: Secondary | ICD-10-CM | POA: Diagnosis not present

## 2018-04-23 DIAGNOSIS — M791 Myalgia, unspecified site: Secondary | ICD-10-CM | POA: Diagnosis not present

## 2018-04-23 DIAGNOSIS — D8989 Other specified disorders involving the immune mechanism, not elsewhere classified: Secondary | ICD-10-CM | POA: Diagnosis not present

## 2018-04-23 DIAGNOSIS — M2550 Pain in unspecified joint: Secondary | ICD-10-CM | POA: Diagnosis not present

## 2018-04-23 DIAGNOSIS — R768 Other specified abnormal immunological findings in serum: Secondary | ICD-10-CM | POA: Diagnosis not present

## 2018-04-23 DIAGNOSIS — R5383 Other fatigue: Secondary | ICD-10-CM | POA: Diagnosis not present

## 2018-04-24 DIAGNOSIS — H25811 Combined forms of age-related cataract, right eye: Secondary | ICD-10-CM | POA: Diagnosis not present

## 2018-05-22 DIAGNOSIS — E103553 Type 1 diabetes mellitus with stable proliferative diabetic retinopathy, bilateral: Secondary | ICD-10-CM | POA: Diagnosis not present

## 2018-05-22 DIAGNOSIS — Z01818 Encounter for other preprocedural examination: Secondary | ICD-10-CM | POA: Diagnosis not present

## 2018-05-24 DIAGNOSIS — H25811 Combined forms of age-related cataract, right eye: Secondary | ICD-10-CM | POA: Diagnosis not present

## 2018-05-24 DIAGNOSIS — H2511 Age-related nuclear cataract, right eye: Secondary | ICD-10-CM | POA: Diagnosis not present

## 2018-05-24 DIAGNOSIS — H52221 Regular astigmatism, right eye: Secondary | ICD-10-CM | POA: Diagnosis not present

## 2018-05-30 DIAGNOSIS — E1065 Type 1 diabetes mellitus with hyperglycemia: Secondary | ICD-10-CM | POA: Diagnosis not present

## 2018-05-30 DIAGNOSIS — Z794 Long term (current) use of insulin: Secondary | ICD-10-CM | POA: Diagnosis not present

## 2018-05-30 DIAGNOSIS — E109 Type 1 diabetes mellitus without complications: Secondary | ICD-10-CM | POA: Diagnosis not present

## 2018-07-09 DIAGNOSIS — E1065 Type 1 diabetes mellitus with hyperglycemia: Secondary | ICD-10-CM | POA: Diagnosis not present

## 2018-07-09 DIAGNOSIS — Z794 Long term (current) use of insulin: Secondary | ICD-10-CM | POA: Diagnosis not present

## 2018-07-09 DIAGNOSIS — E109 Type 1 diabetes mellitus without complications: Secondary | ICD-10-CM | POA: Diagnosis not present

## 2018-07-10 DIAGNOSIS — Z794 Long term (current) use of insulin: Secondary | ICD-10-CM | POA: Diagnosis not present

## 2018-07-10 DIAGNOSIS — E109 Type 1 diabetes mellitus without complications: Secondary | ICD-10-CM | POA: Diagnosis not present

## 2018-07-20 DIAGNOSIS — R0602 Shortness of breath: Secondary | ICD-10-CM | POA: Diagnosis not present

## 2018-07-20 DIAGNOSIS — J209 Acute bronchitis, unspecified: Secondary | ICD-10-CM | POA: Diagnosis not present

## 2018-07-20 DIAGNOSIS — J069 Acute upper respiratory infection, unspecified: Secondary | ICD-10-CM | POA: Diagnosis not present

## 2018-07-20 DIAGNOSIS — E109 Type 1 diabetes mellitus without complications: Secondary | ICD-10-CM | POA: Diagnosis not present

## 2018-07-26 DIAGNOSIS — J209 Acute bronchitis, unspecified: Secondary | ICD-10-CM | POA: Diagnosis not present

## 2018-08-10 DIAGNOSIS — Z794 Long term (current) use of insulin: Secondary | ICD-10-CM | POA: Diagnosis not present

## 2018-08-10 DIAGNOSIS — E109 Type 1 diabetes mellitus without complications: Secondary | ICD-10-CM | POA: Diagnosis not present

## 2018-08-10 DIAGNOSIS — E1065 Type 1 diabetes mellitus with hyperglycemia: Secondary | ICD-10-CM | POA: Diagnosis not present

## 2018-08-28 DIAGNOSIS — E103593 Type 1 diabetes mellitus with proliferative diabetic retinopathy without macular edema, bilateral: Secondary | ICD-10-CM | POA: Diagnosis not present

## 2018-09-13 DIAGNOSIS — E039 Hypothyroidism, unspecified: Secondary | ICD-10-CM | POA: Diagnosis not present

## 2018-09-13 DIAGNOSIS — E109 Type 1 diabetes mellitus without complications: Secondary | ICD-10-CM | POA: Diagnosis not present

## 2018-09-20 DIAGNOSIS — Z9641 Presence of insulin pump (external) (internal): Secondary | ICD-10-CM | POA: Diagnosis not present

## 2018-09-20 DIAGNOSIS — E039 Hypothyroidism, unspecified: Secondary | ICD-10-CM | POA: Diagnosis not present

## 2018-09-20 DIAGNOSIS — E11319 Type 2 diabetes mellitus with unspecified diabetic retinopathy without macular edema: Secondary | ICD-10-CM | POA: Diagnosis not present

## 2018-09-20 DIAGNOSIS — E109 Type 1 diabetes mellitus without complications: Secondary | ICD-10-CM | POA: Diagnosis not present

## 2018-11-01 DIAGNOSIS — E039 Hypothyroidism, unspecified: Secondary | ICD-10-CM | POA: Diagnosis not present

## 2018-12-13 DIAGNOSIS — L700 Acne vulgaris: Secondary | ICD-10-CM | POA: Diagnosis not present

## 2018-12-13 DIAGNOSIS — L02811 Cutaneous abscess of head [any part, except face]: Secondary | ICD-10-CM | POA: Diagnosis not present

## 2018-12-13 DIAGNOSIS — L02213 Cutaneous abscess of chest wall: Secondary | ICD-10-CM | POA: Diagnosis not present

## 2019-01-29 DIAGNOSIS — E103553 Type 1 diabetes mellitus with stable proliferative diabetic retinopathy, bilateral: Secondary | ICD-10-CM | POA: Diagnosis not present

## 2019-03-17 IMAGING — MR MR ORBITS WO/W CM
15 of 16 series · 36 of 48 positions shown · IV contrast (multihance)
Comparison: 11/09/2016 MRI of the head.

CLINICAL DATA: 30 y/o F; episodes of dizziness with visual changes
in the right eye for 3 months.

EXAM:
MRI HEAD AND ORBITS WITHOUT AND WITH CONTRAST
TECHNIQUE: Multiplanar, multiecho pulse sequences of the brain and surrounding
structures were obtained without and with intravenous contrast.
Multiplanar, multiecho pulse sequences of the orbits and surrounding
structures were obtained including fat saturation techniques, before
and after intravenous contrast administration.
CONTRAST:  19mL MULTIHANCE GADOBENATE DIMEGLUMINE 529 MG/ML IV SOLN

[Series 3: t1_se_sag · sagittal · 5.0mm · 0.45mm/px · 1 of 21 slices shown]
[im 1/21]
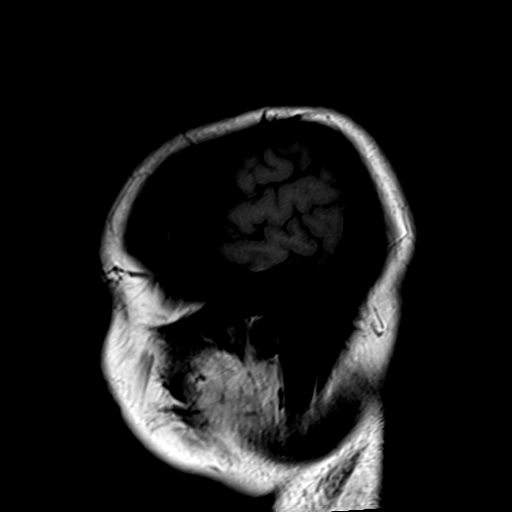

[Series 4: ep2d_diff_(id)_trace · axial · 3.0mm · 1.80mm/px · z∈[-57,+84]mm · 6 of 96 slices shown]
[im 1/96]
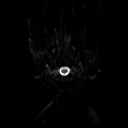
[im 20/96]
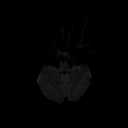
[im 39/96]
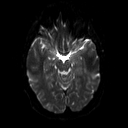
[im 58/96]
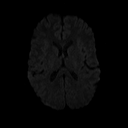
[im 77/96]
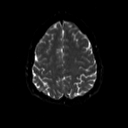
[im 96/96]
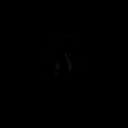

[Series 5: ep2d_diff_(id)_trace_adc · axial · 3.0mm · 1.80mm/px · z∈[-57,+84]mm · 3 of 47 slices shown]
[im 1/47]
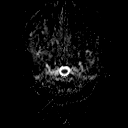
[im 24/47]
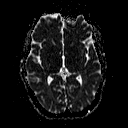
[im 47/47]
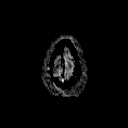

[Series 7: swi_images · axial · 5.0mm · 0.90mm/px · z∈[-58,+87]mm · 2 of 30 slices shown]
[im 1/30]
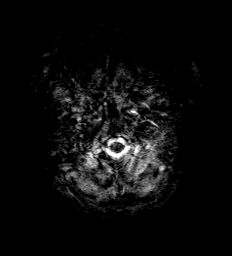
[im 30/30]
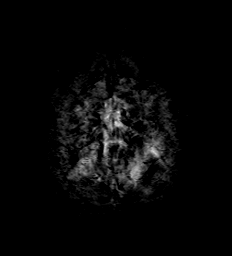

[Series 8: FLAIR · axial · 3.0mm · 0.43mm/px · z∈[-54,+83]mm · 2 of 24 slices shown]
[im 1/24]
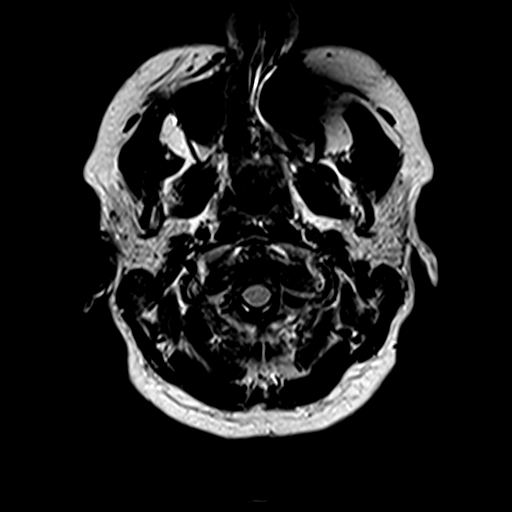
[im 24/24]
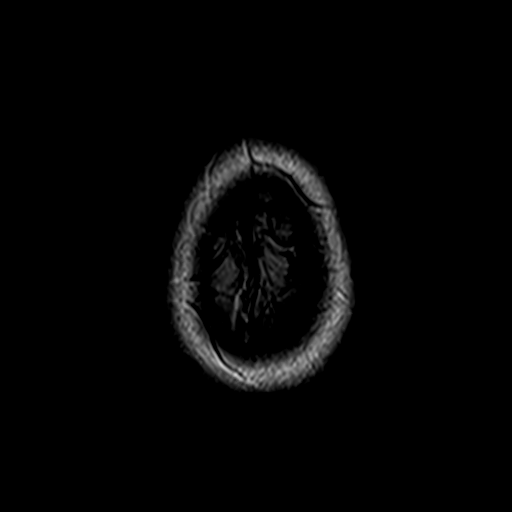

[Series 9: t2_tse_tra_512 · axial · 5.0mm · 0.60mm/px · 1 of 22 slices shown]
[im 1/22]
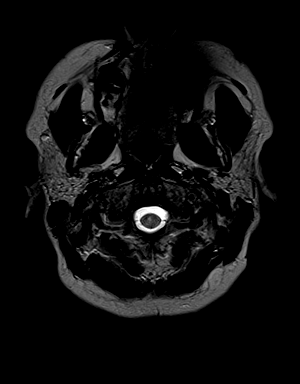

[Series 10: t1_mpr_tra · axial · 1.1mm · 0.72mm/px · z∈[-64,+93]mm · 8 of 144 slices shown]
[im 1/144]
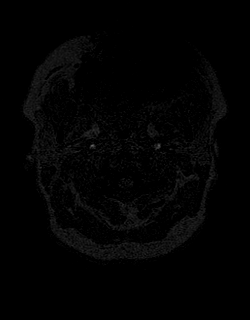
[im 16/144]
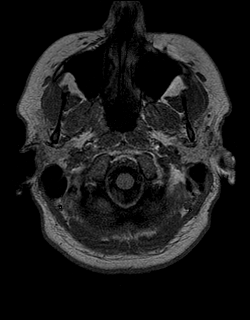
[im 48/144]
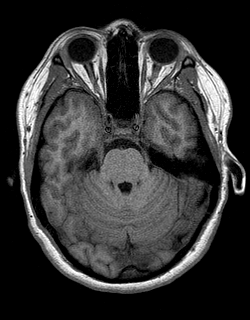
[im 64/144]
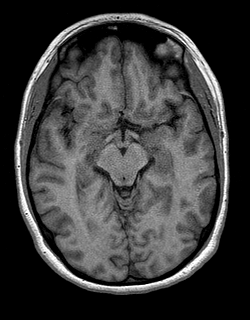
[im 80/144]
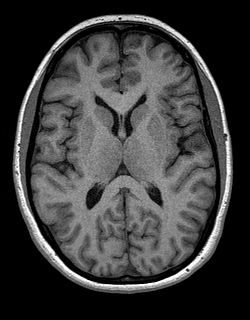
[im 96/144]
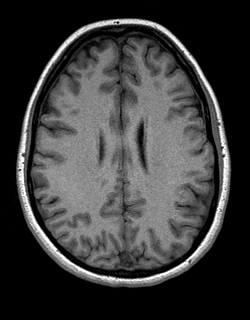
[im 128/144]
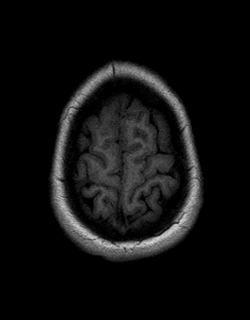
[im 144/144]
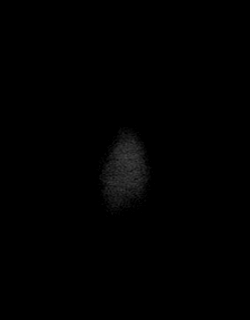

[Series 11: T1 · coronal · 3.0mm · 0.35mm/px · 2 of 24 slices shown (1 of 2)]
[im 1/24]
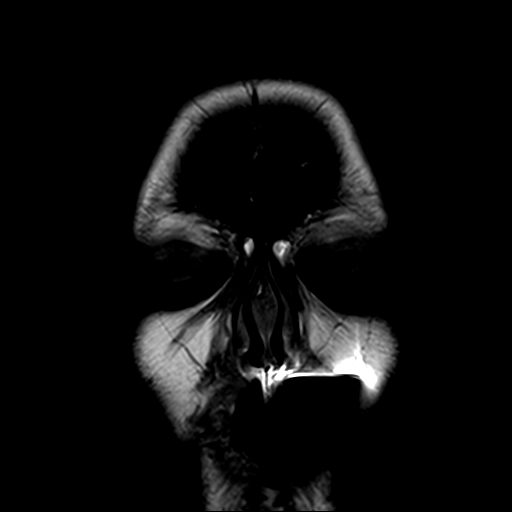
[im 24/24]
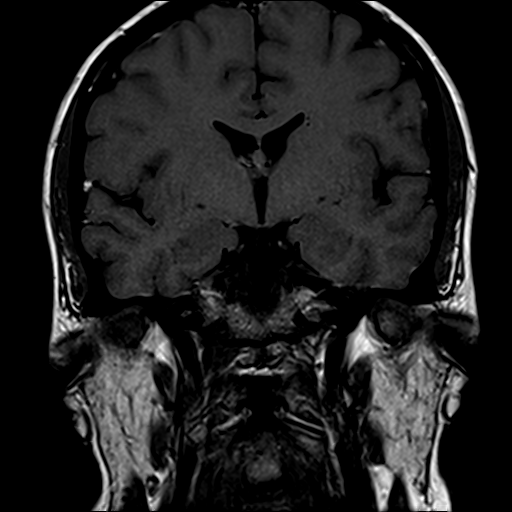

[Series 12: T2 fat-sat · coronal · 3.0mm · 0.35mm/px · 2 of 23 slices shown (1 of 2)]
[im 1/23]
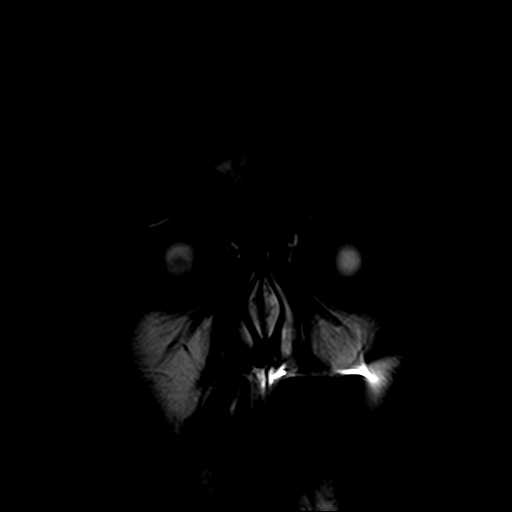
[im 23/23]
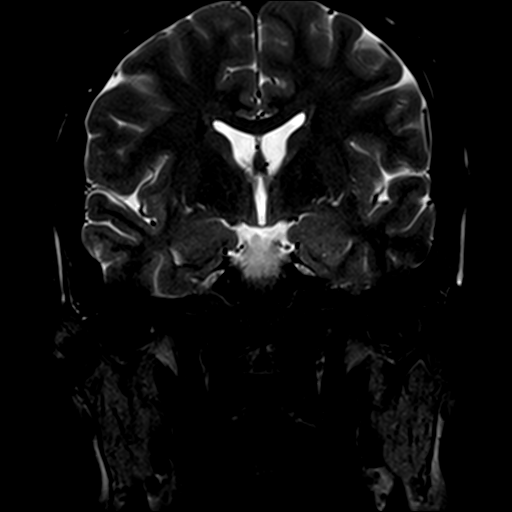

[Series 13: T1 · axial · 3.0mm · 0.35mm/px · 1 of 19 slices shown (2 of 2)]
[im 1/19]
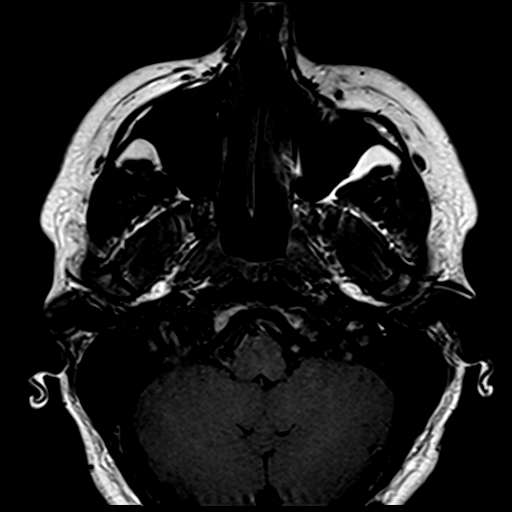

[Series 14: T2 fat-sat · axial · 3.0mm · 0.35mm/px · 1 of 19 slices shown (2 of 2)]
[im 1/19]
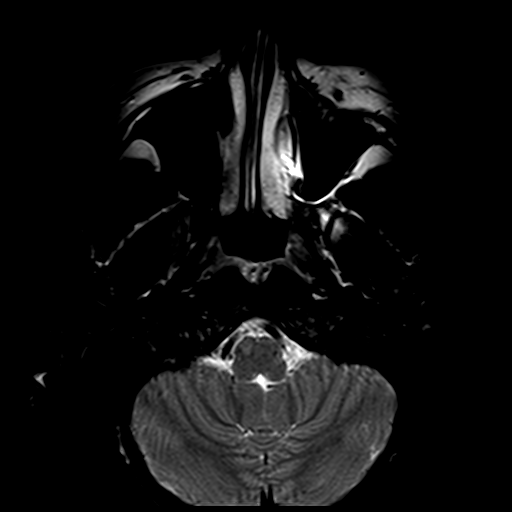

[Series 15: T1 fat-sat · coronal · 3.0mm · 0.35mm/px · 2 of 24 slices shown (1 of 2)]
[im 1/24]
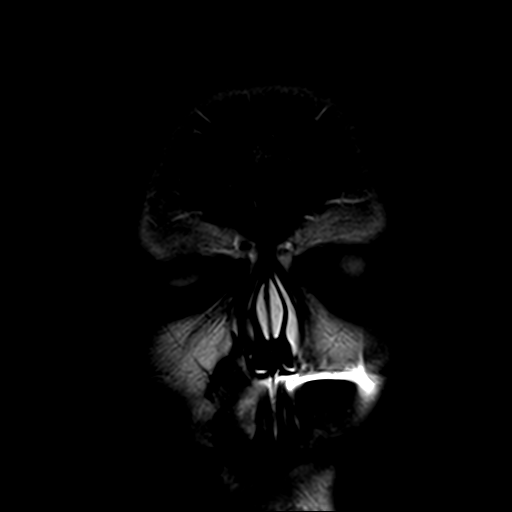
[im 24/24]
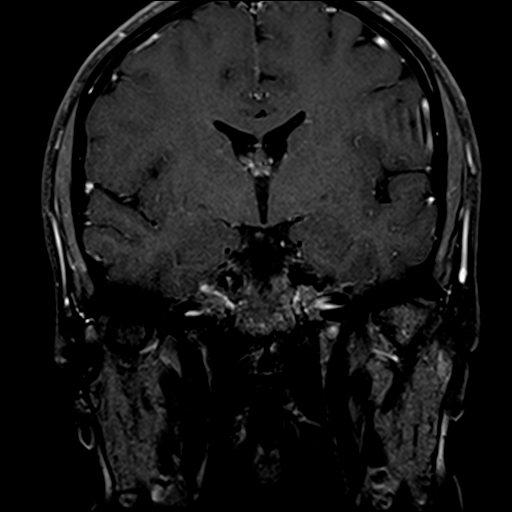

[Series 16: T1 fat-sat · axial · 3.0mm · 0.35mm/px · 1 of 19 slices shown (2 of 2)]
[im 1/19]
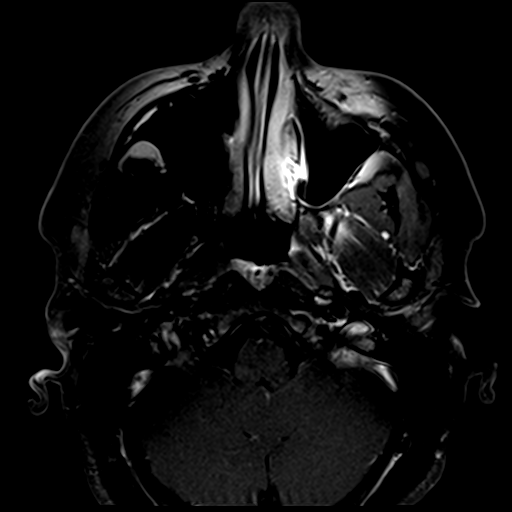

[Series 17: T2 · coronal · 5.0mm · 0.45mm/px · 2 of 27 slices shown]
[im 1/27]
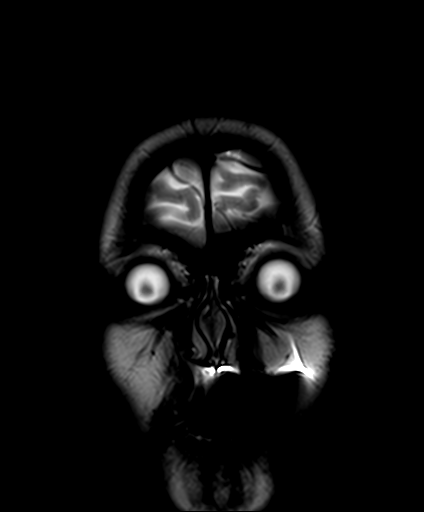
[im 27/27]
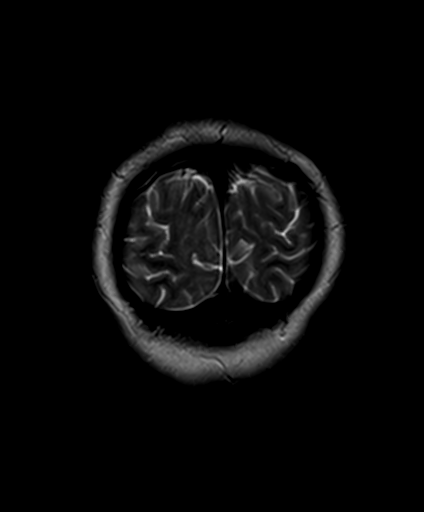

[Series 18: T1 post-contrast · coronal · 5.0mm · 0.72mm/px · 2 of 27 slices shown]
[im 1/27]
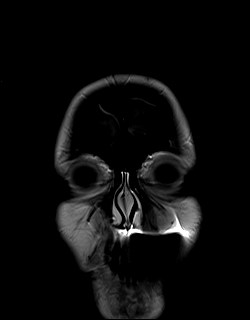
[im 27/27]
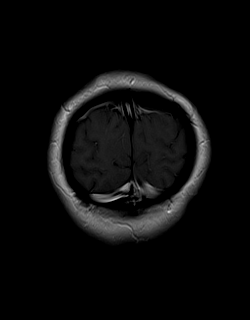

[36 of 48 positions shown; findings below may reference images not displayed]

FINDINGS: MRI HEAD FINDINGS

Brain: No acute infarction, hemorrhage, hydrocephalus, extra-axial
collection or mass lesion. T2 FLAIR hyperintense focus within the
right posterior pons is likely a pulsation artifact as there is no
corresponding signal abnormality on other sequences. After
administration of intravenous contrast there is no abnormal
enhancement of the brain.

Vascular: Normal flow voids.

Skull and upper cervical spine: Normal marrow signal.

Other: None.

MRI ORBITS FINDINGS

Orbits: No traumatic or inflammatory finding. Globes, optic nerves,
orbital fat, extraocular muscles, vascular structures, and lacrimal
glands are normal. Poor fat suppression of the left orbit due to
dental hardware. After administration of intravenous contrast there
is no abnormal enhancement.

Visualized sinuses: Clear.

Soft tissues: Negative.

Limited intracranial: No significant or unexpected finding.
IMPRESSION: 1. No acute intracranial abnormality or abnormal enhancement.
Unremarkable MRI of the brain.
2. No mass or abnormal enhancement of the optic nerve complexes.
Unremarkable MRI of the orbits.

By: Colochita Kamboj M.D.

## 2019-03-20 DIAGNOSIS — E109 Type 1 diabetes mellitus without complications: Secondary | ICD-10-CM | POA: Diagnosis not present

## 2019-03-20 DIAGNOSIS — E039 Hypothyroidism, unspecified: Secondary | ICD-10-CM | POA: Diagnosis not present

## 2019-03-21 DIAGNOSIS — E109 Type 1 diabetes mellitus without complications: Secondary | ICD-10-CM | POA: Diagnosis not present

## 2019-03-21 DIAGNOSIS — Z9641 Presence of insulin pump (external) (internal): Secondary | ICD-10-CM | POA: Diagnosis not present

## 2019-03-21 DIAGNOSIS — E039 Hypothyroidism, unspecified: Secondary | ICD-10-CM | POA: Diagnosis not present

## 2019-03-21 DIAGNOSIS — E11319 Type 2 diabetes mellitus with unspecified diabetic retinopathy without macular edema: Secondary | ICD-10-CM | POA: Diagnosis not present

## 2019-05-28 DIAGNOSIS — E103593 Type 1 diabetes mellitus with proliferative diabetic retinopathy without macular edema, bilateral: Secondary | ICD-10-CM | POA: Diagnosis not present

## 2019-05-28 DIAGNOSIS — E103553 Type 1 diabetes mellitus with stable proliferative diabetic retinopathy, bilateral: Secondary | ICD-10-CM | POA: Diagnosis not present

## 2019-06-07 ENCOUNTER — Encounter (INDEPENDENT_AMBULATORY_CARE_PROVIDER_SITE_OTHER): Payer: Self-pay

## 2019-06-25 DIAGNOSIS — H26491 Other secondary cataract, right eye: Secondary | ICD-10-CM | POA: Diagnosis not present

## 2019-09-18 DIAGNOSIS — E039 Hypothyroidism, unspecified: Secondary | ICD-10-CM | POA: Diagnosis not present

## 2019-09-18 DIAGNOSIS — E109 Type 1 diabetes mellitus without complications: Secondary | ICD-10-CM | POA: Diagnosis not present

## 2019-09-19 DIAGNOSIS — E109 Type 1 diabetes mellitus without complications: Secondary | ICD-10-CM | POA: Diagnosis not present

## 2019-09-19 DIAGNOSIS — E039 Hypothyroidism, unspecified: Secondary | ICD-10-CM | POA: Diagnosis not present

## 2019-09-19 DIAGNOSIS — E11319 Type 2 diabetes mellitus with unspecified diabetic retinopathy without macular edema: Secondary | ICD-10-CM | POA: Diagnosis not present

## 2019-09-19 DIAGNOSIS — Z9641 Presence of insulin pump (external) (internal): Secondary | ICD-10-CM | POA: Diagnosis not present

## 2019-10-01 DIAGNOSIS — E103553 Type 1 diabetes mellitus with stable proliferative diabetic retinopathy, bilateral: Secondary | ICD-10-CM | POA: Diagnosis not present

## 2019-11-01 DIAGNOSIS — Z9641 Presence of insulin pump (external) (internal): Secondary | ICD-10-CM | POA: Diagnosis not present

## 2019-11-01 DIAGNOSIS — R5383 Other fatigue: Secondary | ICD-10-CM | POA: Diagnosis not present

## 2019-11-01 DIAGNOSIS — E109 Type 1 diabetes mellitus without complications: Secondary | ICD-10-CM | POA: Diagnosis not present

## 2019-11-01 DIAGNOSIS — E11319 Type 2 diabetes mellitus with unspecified diabetic retinopathy without macular edema: Secondary | ICD-10-CM | POA: Diagnosis not present

## 2019-11-01 DIAGNOSIS — E039 Hypothyroidism, unspecified: Secondary | ICD-10-CM | POA: Diagnosis not present

## 2019-11-01 DIAGNOSIS — R609 Edema, unspecified: Secondary | ICD-10-CM | POA: Diagnosis not present

## 2019-12-27 DIAGNOSIS — E103553 Type 1 diabetes mellitus with stable proliferative diabetic retinopathy, bilateral: Secondary | ICD-10-CM | POA: Diagnosis not present

## 2020-02-27 DIAGNOSIS — R609 Edema, unspecified: Secondary | ICD-10-CM | POA: Diagnosis not present

## 2020-02-27 DIAGNOSIS — R300 Dysuria: Secondary | ICD-10-CM | POA: Diagnosis not present

## 2020-03-03 DIAGNOSIS — E119 Type 2 diabetes mellitus without complications: Secondary | ICD-10-CM | POA: Diagnosis not present

## 2020-03-03 DIAGNOSIS — N3000 Acute cystitis without hematuria: Secondary | ICD-10-CM | POA: Diagnosis not present

## 2020-03-03 DIAGNOSIS — H9202 Otalgia, left ear: Secondary | ICD-10-CM | POA: Diagnosis not present

## 2020-03-03 DIAGNOSIS — N3090 Cystitis, unspecified without hematuria: Secondary | ICD-10-CM | POA: Diagnosis not present

## 2020-03-16 DIAGNOSIS — J0300 Acute streptococcal tonsillitis, unspecified: Secondary | ICD-10-CM | POA: Diagnosis not present

## 2020-03-16 DIAGNOSIS — J028 Acute pharyngitis due to other specified organisms: Secondary | ICD-10-CM | POA: Diagnosis not present

## 2020-04-06 DIAGNOSIS — E103593 Type 1 diabetes mellitus with proliferative diabetic retinopathy without macular edema, bilateral: Secondary | ICD-10-CM | POA: Diagnosis not present

## 2020-05-11 DIAGNOSIS — E039 Hypothyroidism, unspecified: Secondary | ICD-10-CM | POA: Diagnosis not present

## 2020-05-11 DIAGNOSIS — Z9641 Presence of insulin pump (external) (internal): Secondary | ICD-10-CM | POA: Diagnosis not present

## 2020-05-11 DIAGNOSIS — E109 Type 1 diabetes mellitus without complications: Secondary | ICD-10-CM | POA: Diagnosis not present

## 2020-05-19 DIAGNOSIS — E109 Type 1 diabetes mellitus without complications: Secondary | ICD-10-CM | POA: Diagnosis not present

## 2020-05-19 DIAGNOSIS — E039 Hypothyroidism, unspecified: Secondary | ICD-10-CM | POA: Diagnosis not present

## 2020-05-19 DIAGNOSIS — E11319 Type 2 diabetes mellitus with unspecified diabetic retinopathy without macular edema: Secondary | ICD-10-CM | POA: Diagnosis not present

## 2020-05-19 DIAGNOSIS — Z9641 Presence of insulin pump (external) (internal): Secondary | ICD-10-CM | POA: Diagnosis not present

## 2020-07-21 DIAGNOSIS — R5383 Other fatigue: Secondary | ICD-10-CM | POA: Diagnosis not present

## 2020-07-21 DIAGNOSIS — R400 Somnolence: Secondary | ICD-10-CM | POA: Diagnosis not present

## 2020-07-21 DIAGNOSIS — E039 Hypothyroidism, unspecified: Secondary | ICD-10-CM | POA: Diagnosis not present

## 2020-07-21 DIAGNOSIS — E559 Vitamin D deficiency, unspecified: Secondary | ICD-10-CM | POA: Diagnosis not present

## 2020-07-21 DIAGNOSIS — R140 Abdominal distension (gaseous): Secondary | ICD-10-CM | POA: Diagnosis not present

## 2020-07-21 DIAGNOSIS — E11319 Type 2 diabetes mellitus with unspecified diabetic retinopathy without macular edema: Secondary | ICD-10-CM | POA: Diagnosis not present

## 2020-07-22 DIAGNOSIS — E103593 Type 1 diabetes mellitus with proliferative diabetic retinopathy without macular edema, bilateral: Secondary | ICD-10-CM | POA: Diagnosis not present

## 2020-08-03 DIAGNOSIS — R5383 Other fatigue: Secondary | ICD-10-CM | POA: Diagnosis not present

## 2020-08-03 DIAGNOSIS — E109 Type 1 diabetes mellitus without complications: Secondary | ICD-10-CM | POA: Diagnosis not present

## 2020-08-03 DIAGNOSIS — E039 Hypothyroidism, unspecified: Secondary | ICD-10-CM | POA: Diagnosis not present

## 2020-08-07 DIAGNOSIS — N3090 Cystitis, unspecified without hematuria: Secondary | ICD-10-CM | POA: Diagnosis not present

## 2020-08-07 DIAGNOSIS — N3000 Acute cystitis without hematuria: Secondary | ICD-10-CM | POA: Diagnosis not present

## 2020-08-11 ENCOUNTER — Other Ambulatory Visit: Payer: Self-pay | Admitting: Gastroenterology

## 2020-08-11 ENCOUNTER — Other Ambulatory Visit (HOSPITAL_COMMUNITY): Payer: Self-pay | Admitting: Gastroenterology

## 2020-08-11 DIAGNOSIS — R1011 Right upper quadrant pain: Secondary | ICD-10-CM | POA: Diagnosis not present

## 2020-08-11 DIAGNOSIS — R5383 Other fatigue: Secondary | ICD-10-CM | POA: Diagnosis not present

## 2020-08-11 DIAGNOSIS — R110 Nausea: Secondary | ICD-10-CM

## 2020-08-29 DIAGNOSIS — N3090 Cystitis, unspecified without hematuria: Secondary | ICD-10-CM | POA: Diagnosis not present

## 2020-08-29 DIAGNOSIS — N3001 Acute cystitis with hematuria: Secondary | ICD-10-CM | POA: Diagnosis not present

## 2020-09-02 ENCOUNTER — Ambulatory Visit (HOSPITAL_COMMUNITY): Payer: BC Managed Care – PPO

## 2020-09-30 ENCOUNTER — Ambulatory Visit (HOSPITAL_COMMUNITY): Payer: BC Managed Care – PPO

## 2020-10-19 DIAGNOSIS — E103593 Type 1 diabetes mellitus with proliferative diabetic retinopathy without macular edema, bilateral: Secondary | ICD-10-CM | POA: Diagnosis not present

## 2020-12-23 DIAGNOSIS — E109 Type 1 diabetes mellitus without complications: Secondary | ICD-10-CM | POA: Diagnosis not present

## 2020-12-23 DIAGNOSIS — E039 Hypothyroidism, unspecified: Secondary | ICD-10-CM | POA: Diagnosis not present

## 2020-12-23 DIAGNOSIS — R5383 Other fatigue: Secondary | ICD-10-CM | POA: Diagnosis not present

## 2020-12-28 DIAGNOSIS — E109 Type 1 diabetes mellitus without complications: Secondary | ICD-10-CM | POA: Diagnosis not present

## 2020-12-28 DIAGNOSIS — Z9641 Presence of insulin pump (external) (internal): Secondary | ICD-10-CM | POA: Diagnosis not present

## 2020-12-28 DIAGNOSIS — E039 Hypothyroidism, unspecified: Secondary | ICD-10-CM | POA: Diagnosis not present

## 2020-12-28 DIAGNOSIS — R400 Somnolence: Secondary | ICD-10-CM | POA: Diagnosis not present

## 2021-01-08 DIAGNOSIS — E103593 Type 1 diabetes mellitus with proliferative diabetic retinopathy without macular edema, bilateral: Secondary | ICD-10-CM | POA: Diagnosis not present

## 2021-01-18 ENCOUNTER — Encounter (HOSPITAL_COMMUNITY): Payer: Self-pay

## 2021-01-18 ENCOUNTER — Ambulatory Visit (HOSPITAL_COMMUNITY): Admission: RE | Admit: 2021-01-18 | Payer: BC Managed Care – PPO | Source: Ambulatory Visit

## 2021-01-28 DIAGNOSIS — R152 Fecal urgency: Secondary | ICD-10-CM | POA: Diagnosis not present

## 2021-01-28 DIAGNOSIS — R151 Fecal smearing: Secondary | ICD-10-CM | POA: Diagnosis not present

## 2021-01-28 DIAGNOSIS — E109 Type 1 diabetes mellitus without complications: Secondary | ICD-10-CM | POA: Diagnosis not present

## 2021-01-28 DIAGNOSIS — R768 Other specified abnormal immunological findings in serum: Secondary | ICD-10-CM | POA: Diagnosis not present

## 2021-02-08 DIAGNOSIS — E103593 Type 1 diabetes mellitus with proliferative diabetic retinopathy without macular edema, bilateral: Secondary | ICD-10-CM | POA: Diagnosis not present

## 2021-04-01 DIAGNOSIS — E785 Hyperlipidemia, unspecified: Secondary | ICD-10-CM | POA: Diagnosis not present

## 2021-04-01 DIAGNOSIS — E559 Vitamin D deficiency, unspecified: Secondary | ICD-10-CM | POA: Diagnosis not present

## 2021-04-01 DIAGNOSIS — E109 Type 1 diabetes mellitus without complications: Secondary | ICD-10-CM | POA: Diagnosis not present

## 2021-04-01 DIAGNOSIS — E039 Hypothyroidism, unspecified: Secondary | ICD-10-CM | POA: Diagnosis not present

## 2021-04-09 DIAGNOSIS — R400 Somnolence: Secondary | ICD-10-CM | POA: Diagnosis not present

## 2021-04-09 DIAGNOSIS — E109 Type 1 diabetes mellitus without complications: Secondary | ICD-10-CM | POA: Diagnosis not present

## 2021-04-09 DIAGNOSIS — E039 Hypothyroidism, unspecified: Secondary | ICD-10-CM | POA: Diagnosis not present

## 2021-04-09 DIAGNOSIS — Z9641 Presence of insulin pump (external) (internal): Secondary | ICD-10-CM | POA: Diagnosis not present

## 2021-06-07 DIAGNOSIS — E103553 Type 1 diabetes mellitus with stable proliferative diabetic retinopathy, bilateral: Secondary | ICD-10-CM | POA: Diagnosis not present

## 2021-06-15 DIAGNOSIS — E039 Hypothyroidism, unspecified: Secondary | ICD-10-CM | POA: Diagnosis not present

## 2021-06-15 DIAGNOSIS — N390 Urinary tract infection, site not specified: Secondary | ICD-10-CM | POA: Diagnosis not present

## 2021-06-21 DIAGNOSIS — L7211 Pilar cyst: Secondary | ICD-10-CM | POA: Diagnosis not present

## 2021-07-10 DIAGNOSIS — R059 Cough, unspecified: Secondary | ICD-10-CM | POA: Diagnosis not present

## 2021-10-06 DIAGNOSIS — E109 Type 1 diabetes mellitus without complications: Secondary | ICD-10-CM | POA: Diagnosis not present

## 2021-10-06 DIAGNOSIS — E559 Vitamin D deficiency, unspecified: Secondary | ICD-10-CM | POA: Diagnosis not present

## 2021-10-06 DIAGNOSIS — E785 Hyperlipidemia, unspecified: Secondary | ICD-10-CM | POA: Diagnosis not present

## 2021-10-06 DIAGNOSIS — E039 Hypothyroidism, unspecified: Secondary | ICD-10-CM | POA: Diagnosis not present

## 2021-10-11 DIAGNOSIS — R400 Somnolence: Secondary | ICD-10-CM | POA: Diagnosis not present

## 2021-10-11 DIAGNOSIS — R300 Dysuria: Secondary | ICD-10-CM | POA: Diagnosis not present

## 2021-10-11 DIAGNOSIS — R8289 Other abnormal findings on cytological and histological examination of urine: Secondary | ICD-10-CM | POA: Diagnosis not present

## 2021-10-11 DIAGNOSIS — E039 Hypothyroidism, unspecified: Secondary | ICD-10-CM | POA: Diagnosis not present

## 2021-10-11 DIAGNOSIS — Z9641 Presence of insulin pump (external) (internal): Secondary | ICD-10-CM | POA: Diagnosis not present

## 2021-10-11 DIAGNOSIS — E109 Type 1 diabetes mellitus without complications: Secondary | ICD-10-CM | POA: Diagnosis not present

## 2021-10-18 DIAGNOSIS — E103553 Type 1 diabetes mellitus with stable proliferative diabetic retinopathy, bilateral: Secondary | ICD-10-CM | POA: Diagnosis not present

## 2021-11-26 DIAGNOSIS — E109 Type 1 diabetes mellitus without complications: Secondary | ICD-10-CM | POA: Diagnosis not present

## 2021-11-26 DIAGNOSIS — Z9641 Presence of insulin pump (external) (internal): Secondary | ICD-10-CM | POA: Diagnosis not present

## 2021-11-26 DIAGNOSIS — E039 Hypothyroidism, unspecified: Secondary | ICD-10-CM | POA: Diagnosis not present

## 2021-12-21 DIAGNOSIS — Z6835 Body mass index (BMI) 35.0-35.9, adult: Secondary | ICD-10-CM | POA: Diagnosis not present

## 2021-12-21 DIAGNOSIS — Z01419 Encounter for gynecological examination (general) (routine) without abnormal findings: Secondary | ICD-10-CM | POA: Diagnosis not present

## 2021-12-21 DIAGNOSIS — L723 Sebaceous cyst: Secondary | ICD-10-CM | POA: Diagnosis not present

## 2021-12-21 DIAGNOSIS — Z124 Encounter for screening for malignant neoplasm of cervix: Secondary | ICD-10-CM | POA: Diagnosis not present

## 2022-01-13 DIAGNOSIS — H04123 Dry eye syndrome of bilateral lacrimal glands: Secondary | ICD-10-CM | POA: Diagnosis not present

## 2022-02-09 DIAGNOSIS — L7211 Pilar cyst: Secondary | ICD-10-CM | POA: Diagnosis not present

## 2022-02-28 DIAGNOSIS — E103553 Type 1 diabetes mellitus with stable proliferative diabetic retinopathy, bilateral: Secondary | ICD-10-CM | POA: Diagnosis not present

## 2022-04-13 DIAGNOSIS — K900 Celiac disease: Secondary | ICD-10-CM | POA: Diagnosis not present

## 2022-04-13 DIAGNOSIS — E109 Type 1 diabetes mellitus without complications: Secondary | ICD-10-CM | POA: Diagnosis not present

## 2022-04-15 DIAGNOSIS — R635 Abnormal weight gain: Secondary | ICD-10-CM | POA: Diagnosis not present

## 2022-04-15 DIAGNOSIS — E785 Hyperlipidemia, unspecified: Secondary | ICD-10-CM | POA: Diagnosis not present

## 2022-04-15 DIAGNOSIS — K900 Celiac disease: Secondary | ICD-10-CM | POA: Diagnosis not present

## 2022-04-15 DIAGNOSIS — E039 Hypothyroidism, unspecified: Secondary | ICD-10-CM | POA: Diagnosis not present

## 2022-04-15 DIAGNOSIS — E109 Type 1 diabetes mellitus without complications: Secondary | ICD-10-CM | POA: Diagnosis not present

## 2022-04-26 ENCOUNTER — Institutional Professional Consult (permissible substitution): Payer: BC Managed Care – PPO | Admitting: Nurse Practitioner

## 2022-05-05 ENCOUNTER — Institutional Professional Consult (permissible substitution): Payer: BC Managed Care – PPO | Admitting: Nurse Practitioner

## 2022-05-19 ENCOUNTER — Institutional Professional Consult (permissible substitution): Payer: BC Managed Care – PPO | Admitting: Nurse Practitioner

## 2022-06-30 DIAGNOSIS — J069 Acute upper respiratory infection, unspecified: Secondary | ICD-10-CM | POA: Diagnosis not present

## 2022-06-30 DIAGNOSIS — J029 Acute pharyngitis, unspecified: Secondary | ICD-10-CM | POA: Diagnosis not present

## 2022-06-30 DIAGNOSIS — E119 Type 2 diabetes mellitus without complications: Secondary | ICD-10-CM | POA: Diagnosis not present

## 2022-06-30 DIAGNOSIS — R051 Acute cough: Secondary | ICD-10-CM | POA: Diagnosis not present

## 2022-07-11 DIAGNOSIS — E10319 Type 1 diabetes mellitus with unspecified diabetic retinopathy without macular edema: Secondary | ICD-10-CM | POA: Diagnosis not present

## 2022-07-11 DIAGNOSIS — E038 Other specified hypothyroidism: Secondary | ICD-10-CM | POA: Diagnosis not present

## 2022-07-14 ENCOUNTER — Telehealth: Payer: Self-pay

## 2022-07-14 NOTE — Patient Outreach (Signed)
  Care Coordination   07/14/2022 Name: Jenissa Tyrell MRN: [id number] DOB: October 19, 1985   Care Coordination Outreach Attempts:  An unsuccessful telephone outreach was attempted today to offer the patient information about available care coordination services as a benefit of their health plan.   Follow Up Plan:  Additional outreach attempts will be made to offer the patient care coordination information and services.   Encounter Outcome:  No Answer   Care Coordination Interventions:  No, not indicated    Bary Leriche, RN, MSN [facility] Care Management Care Management Coordinator Direct Line [phone]

## 2022-07-20 DIAGNOSIS — H6691 Otitis media, unspecified, right ear: Secondary | ICD-10-CM | POA: Diagnosis not present

## 2022-07-20 DIAGNOSIS — J029 Acute pharyngitis, unspecified: Secondary | ICD-10-CM | POA: Diagnosis not present

## 2022-07-27 ENCOUNTER — Telehealth: Payer: Self-pay

## 2022-07-27 NOTE — Patient Outreach (Signed)
  Care Coordination   07/27/2022 Name: Marie Cook MRN: [id number] DOB: 10-08-1985   Care Coordination Outreach Attempts:  A second unsuccessful outreach was attempted today to offer the patient with information about available care coordination services as a benefit of their health plan.     Follow Up Plan:  Additional outreach attempts will be made to offer the patient care coordination information and services.   Encounter Outcome:  No Answer   Care Coordination Interventions:  No, not indicated    Bary Leriche, RN, MSN [facility] Care Management Care Management Coordinator Direct Line [phone]

## 2022-08-02 DIAGNOSIS — R151 Fecal smearing: Secondary | ICD-10-CM | POA: Diagnosis not present

## 2022-08-02 DIAGNOSIS — K580 Irritable bowel syndrome with diarrhea: Secondary | ICD-10-CM | POA: Diagnosis not present

## 2022-08-02 DIAGNOSIS — R194 Change in bowel habit: Secondary | ICD-10-CM | POA: Diagnosis not present

## 2022-08-10 DIAGNOSIS — K6389 Other specified diseases of intestine: Secondary | ICD-10-CM | POA: Diagnosis not present

## 2022-08-10 DIAGNOSIS — R197 Diarrhea, unspecified: Secondary | ICD-10-CM | POA: Diagnosis not present

## 2022-08-10 DIAGNOSIS — K635 Polyp of colon: Secondary | ICD-10-CM | POA: Diagnosis not present

## 2022-08-10 DIAGNOSIS — R194 Change in bowel habit: Secondary | ICD-10-CM | POA: Diagnosis not present

## 2022-08-10 DIAGNOSIS — D123 Benign neoplasm of transverse colon: Secondary | ICD-10-CM | POA: Diagnosis not present

## 2022-08-10 DIAGNOSIS — Z1211 Encounter for screening for malignant neoplasm of colon: Secondary | ICD-10-CM | POA: Diagnosis not present

## 2022-08-11 DIAGNOSIS — R29898 Other symptoms and signs involving the musculoskeletal system: Secondary | ICD-10-CM | POA: Diagnosis not present

## 2022-08-11 DIAGNOSIS — M722 Plantar fascial fibromatosis: Secondary | ICD-10-CM | POA: Diagnosis not present

## 2022-08-11 DIAGNOSIS — M79672 Pain in left foot: Secondary | ICD-10-CM | POA: Diagnosis not present

## 2022-08-11 DIAGNOSIS — M84372A Stress fracture, left ankle, initial encounter for fracture: Secondary | ICD-10-CM | POA: Diagnosis not present

## 2022-08-12 ENCOUNTER — Telehealth: Payer: Self-pay

## 2022-08-12 NOTE — Patient Outreach (Signed)
  Care Coordination   08/12/2022 Name: Marie Cook MRN: [id number] DOB: 1985/10/30   Care Coordination Outreach Attempts:  A third unsuccessful outreach was attempted today to offer the patient with information about available care coordination services as a benefit of their health plan.   Follow Up Plan:  No further outreach attempts will be made at this time. We have been unable to contact the patient to offer or enroll patient in care coordination services  Encounter Outcome:  No Answer   Care Coordination Interventions:  No, not indicated    Bary Leriche, RN, MSN [facility] Care Management Care Management Coordinator Direct Line [phone]

## 2022-09-06 DIAGNOSIS — E109 Type 1 diabetes mellitus without complications: Secondary | ICD-10-CM | POA: Diagnosis not present

## 2022-09-06 DIAGNOSIS — Z9641 Presence of insulin pump (external) (internal): Secondary | ICD-10-CM | POA: Diagnosis not present

## 2022-09-06 DIAGNOSIS — E039 Hypothyroidism, unspecified: Secondary | ICD-10-CM | POA: Diagnosis not present

## 2022-09-06 DIAGNOSIS — E559 Vitamin D deficiency, unspecified: Secondary | ICD-10-CM | POA: Diagnosis not present

## 2023-01-12 DIAGNOSIS — L7211 Pilar cyst: Secondary | ICD-10-CM | POA: Diagnosis not present

## 2023-01-21 DIAGNOSIS — L559 Sunburn, unspecified: Secondary | ICD-10-CM | POA: Diagnosis not present

## 2023-01-21 DIAGNOSIS — L02412 Cutaneous abscess of left axilla: Secondary | ICD-10-CM | POA: Diagnosis not present

## 2023-01-21 DIAGNOSIS — L732 Hidradenitis suppurativa: Secondary | ICD-10-CM | POA: Diagnosis not present

## 2023-01-21 DIAGNOSIS — E119 Type 2 diabetes mellitus without complications: Secondary | ICD-10-CM | POA: Diagnosis not present

## 2023-01-23 DIAGNOSIS — E103593 Type 1 diabetes mellitus with proliferative diabetic retinopathy without macular edema, bilateral: Secondary | ICD-10-CM | POA: Diagnosis not present

## 2023-04-06 DIAGNOSIS — E039 Hypothyroidism, unspecified: Secondary | ICD-10-CM | POA: Diagnosis not present

## 2023-04-06 DIAGNOSIS — E109 Type 1 diabetes mellitus without complications: Secondary | ICD-10-CM | POA: Diagnosis not present

## 2023-04-06 DIAGNOSIS — E785 Hyperlipidemia, unspecified: Secondary | ICD-10-CM | POA: Diagnosis not present

## 2023-04-06 DIAGNOSIS — E559 Vitamin D deficiency, unspecified: Secondary | ICD-10-CM | POA: Diagnosis not present

## 2023-04-12 DIAGNOSIS — E103553 Type 1 diabetes mellitus with stable proliferative diabetic retinopathy, bilateral: Secondary | ICD-10-CM | POA: Diagnosis not present

## 2023-06-20 DIAGNOSIS — E103593 Type 1 diabetes mellitus with proliferative diabetic retinopathy without macular edema, bilateral: Secondary | ICD-10-CM | POA: Diagnosis not present

## 2023-11-01 DIAGNOSIS — E103593 Type 1 diabetes mellitus with proliferative diabetic retinopathy without macular edema, bilateral: Secondary | ICD-10-CM | POA: Diagnosis not present

## 2023-11-22 DIAGNOSIS — E039 Hypothyroidism, unspecified: Secondary | ICD-10-CM | POA: Diagnosis not present

## 2023-11-22 DIAGNOSIS — E109 Type 1 diabetes mellitus without complications: Secondary | ICD-10-CM | POA: Diagnosis not present

## 2023-11-22 DIAGNOSIS — Z9641 Presence of insulin pump (external) (internal): Secondary | ICD-10-CM | POA: Diagnosis not present

## 2023-11-22 DIAGNOSIS — E11319 Type 2 diabetes mellitus with unspecified diabetic retinopathy without macular edema: Secondary | ICD-10-CM | POA: Diagnosis not present

## 2023-12-29 DIAGNOSIS — E611 Iron deficiency: Secondary | ICD-10-CM | POA: Diagnosis not present

## 2023-12-29 DIAGNOSIS — E109 Type 1 diabetes mellitus without complications: Secondary | ICD-10-CM | POA: Diagnosis not present

## 2023-12-29 DIAGNOSIS — E559 Vitamin D deficiency, unspecified: Secondary | ICD-10-CM | POA: Diagnosis not present

## 2023-12-29 DIAGNOSIS — E785 Hyperlipidemia, unspecified: Secondary | ICD-10-CM | POA: Diagnosis not present

## 2023-12-29 DIAGNOSIS — E039 Hypothyroidism, unspecified: Secondary | ICD-10-CM | POA: Diagnosis not present

## 2024-02-05 DIAGNOSIS — E103553 Type 1 diabetes mellitus with stable proliferative diabetic retinopathy, bilateral: Secondary | ICD-10-CM | POA: Diagnosis not present

## 2024-05-03 DIAGNOSIS — Z6832 Body mass index (BMI) 32.0-32.9, adult: Secondary | ICD-10-CM | POA: Diagnosis not present

## 2024-05-03 DIAGNOSIS — Z01419 Encounter for gynecological examination (general) (routine) without abnormal findings: Secondary | ICD-10-CM | POA: Diagnosis not present

## 2024-05-22 DIAGNOSIS — E109 Type 1 diabetes mellitus without complications: Secondary | ICD-10-CM | POA: Diagnosis not present

## 2024-05-22 DIAGNOSIS — E785 Hyperlipidemia, unspecified: Secondary | ICD-10-CM | POA: Diagnosis not present

## 2024-05-22 DIAGNOSIS — E039 Hypothyroidism, unspecified: Secondary | ICD-10-CM | POA: Diagnosis not present

## 2024-05-22 DIAGNOSIS — E559 Vitamin D deficiency, unspecified: Secondary | ICD-10-CM | POA: Diagnosis not present

## 2024-06-04 DIAGNOSIS — E103593 Type 1 diabetes mellitus with proliferative diabetic retinopathy without macular edema, bilateral: Secondary | ICD-10-CM | POA: Diagnosis not present

## 2024-06-18 DIAGNOSIS — E039 Hypothyroidism, unspecified: Secondary | ICD-10-CM | POA: Diagnosis not present

## 2024-06-18 DIAGNOSIS — E11319 Type 2 diabetes mellitus with unspecified diabetic retinopathy without macular edema: Secondary | ICD-10-CM | POA: Diagnosis not present

## 2024-06-18 DIAGNOSIS — E109 Type 1 diabetes mellitus without complications: Secondary | ICD-10-CM | POA: Diagnosis not present

## 2024-06-18 DIAGNOSIS — Z9641 Presence of insulin pump (external) (internal): Secondary | ICD-10-CM | POA: Diagnosis not present

## 2024-07-22 DIAGNOSIS — L7211 Pilar cyst: Secondary | ICD-10-CM | POA: Diagnosis not present
